# Patient Record
Sex: Female | Born: 1935 | Hispanic: No | Marital: Single | Smoking: Never smoker
Health system: Southern US, Community
[De-identification: ages and names within clinical notes are randomized; demographics above are authoritative.]

## PROBLEM LIST (undated history)

## (undated) DIAGNOSIS — I2510 Atherosclerotic heart disease of native coronary artery without angina pectoris: Secondary | ICD-10-CM

## (undated) DIAGNOSIS — N390 Urinary tract infection, site not specified: Secondary | ICD-10-CM

## (undated) DIAGNOSIS — I509 Heart failure, unspecified: Secondary | ICD-10-CM

## (undated) DIAGNOSIS — J45909 Unspecified asthma, uncomplicated: Secondary | ICD-10-CM

## (undated) DIAGNOSIS — E119 Type 2 diabetes mellitus without complications: Secondary | ICD-10-CM

## (undated) DIAGNOSIS — J449 Chronic obstructive pulmonary disease, unspecified: Secondary | ICD-10-CM

## (undated) DIAGNOSIS — K219 Gastro-esophageal reflux disease without esophagitis: Secondary | ICD-10-CM

## (undated) DIAGNOSIS — I10 Essential (primary) hypertension: Secondary | ICD-10-CM

## (undated) HISTORY — PX: CORONARY ARTERY BYPASS GRAFT: SHX141

## (undated) HISTORY — PX: ABDOMINAL HYSTERECTOMY: SHX81

## (undated) HISTORY — PX: APPENDECTOMY: SHX54

---

## 2015-03-17 ENCOUNTER — Emergency Department (HOSPITAL_COMMUNITY)
Admission: EM | Admit: 2015-03-17 | Discharge: 2015-03-17 | Disposition: A | Discharge status: Home / Self Care | Payer: Medicare Other | Attending: Emergency Medicine | Admitting: Emergency Medicine

## 2015-03-17 ENCOUNTER — Encounter (HOSPITAL_COMMUNITY): Payer: Self-pay | Admitting: Emergency Medicine

## 2015-03-17 ENCOUNTER — Other Ambulatory Visit (HOSPITAL_COMMUNITY): Payer: Medicare Other

## 2015-03-17 ENCOUNTER — Emergency Department (HOSPITAL_COMMUNITY): Payer: Medicare Other

## 2015-03-17 DIAGNOSIS — R1030 Lower abdominal pain, unspecified: Secondary | ICD-10-CM | POA: Diagnosis not present

## 2015-03-17 DIAGNOSIS — J449 Chronic obstructive pulmonary disease, unspecified: Secondary | ICD-10-CM | POA: Insufficient documentation

## 2015-03-17 DIAGNOSIS — I509 Heart failure, unspecified: Secondary | ICD-10-CM | POA: Insufficient documentation

## 2015-03-17 DIAGNOSIS — R2689 Other abnormalities of gait and mobility: Secondary | ICD-10-CM | POA: Insufficient documentation

## 2015-03-17 DIAGNOSIS — D649 Anemia, unspecified: Secondary | ICD-10-CM | POA: Insufficient documentation

## 2015-03-17 DIAGNOSIS — I10 Essential (primary) hypertension: Secondary | ICD-10-CM | POA: Insufficient documentation

## 2015-03-17 DIAGNOSIS — E119 Type 2 diabetes mellitus without complications: Secondary | ICD-10-CM | POA: Insufficient documentation

## 2015-03-17 DIAGNOSIS — Z8719 Personal history of other diseases of the digestive system: Secondary | ICD-10-CM | POA: Insufficient documentation

## 2015-03-17 DIAGNOSIS — R110 Nausea: Secondary | ICD-10-CM | POA: Diagnosis not present

## 2015-03-17 DIAGNOSIS — Z8742 Personal history of other diseases of the female genital tract: Secondary | ICD-10-CM | POA: Diagnosis not present

## 2015-03-17 DIAGNOSIS — Z9089 Acquired absence of other organs: Secondary | ICD-10-CM | POA: Diagnosis not present

## 2015-03-17 DIAGNOSIS — R269 Unspecified abnormalities of gait and mobility: Secondary | ICD-10-CM

## 2015-03-17 DIAGNOSIS — Z90710 Acquired absence of both cervix and uterus: Secondary | ICD-10-CM | POA: Diagnosis not present

## 2015-03-17 HISTORY — DX: Type 2 diabetes mellitus without complications: E11.9

## 2015-03-17 HISTORY — DX: Chronic obstructive pulmonary disease, unspecified: J44.9

## 2015-03-17 HISTORY — DX: Unspecified asthma, uncomplicated: J45.909

## 2015-03-17 HISTORY — DX: Gastro-esophageal reflux disease without esophagitis: K21.9

## 2015-03-17 HISTORY — DX: Essential (primary) hypertension: I10

## 2015-03-17 HISTORY — DX: Heart failure, unspecified: I50.9

## 2015-03-17 LAB — URINALYSIS, ROUTINE W REFLEX MICROSCOPIC
Bilirubin Urine: NEGATIVE
Glucose, UA: NEGATIVE mg/dL
Hgb urine dipstick: NEGATIVE
KETONES UR: NEGATIVE mg/dL
LEUKOCYTES UA: NEGATIVE
Nitrite: NEGATIVE
PH: 6 (ref 5.0–8.0)
Protein, ur: 30 mg/dL — AB
Specific Gravity, Urine: 1.01 (ref 1.005–1.030)
Urobilinogen, UA: 0.2 mg/dL (ref 0.0–1.0)

## 2015-03-17 LAB — CBC WITH DIFFERENTIAL/PLATELET
Basophils Absolute: 0 10*3/uL (ref 0.0–0.1)
Basophils Relative: 0 % (ref 0–1)
EOS ABS: 0.1 10*3/uL (ref 0.0–0.7)
Eosinophils Relative: 3 % (ref 0–5)
HCT: 27.1 % — ABNORMAL LOW (ref 36.0–46.0)
Hemoglobin: 8.6 g/dL — ABNORMAL LOW (ref 12.0–15.0)
LYMPHS ABS: 0.9 10*3/uL (ref 0.7–4.0)
Lymphocytes Relative: 17 % (ref 12–46)
MCH: 27.3 pg (ref 26.0–34.0)
MCHC: 31.7 g/dL (ref 30.0–36.0)
MCV: 86 fL (ref 78.0–100.0)
Monocytes Absolute: 0.3 10*3/uL (ref 0.1–1.0)
Monocytes Relative: 6 % (ref 3–12)
NEUTROS ABS: 3.9 10*3/uL (ref 1.7–7.7)
NEUTROS PCT: 74 % (ref 43–77)
Platelets: 256 10*3/uL (ref 150–400)
RBC: 3.15 MIL/uL — ABNORMAL LOW (ref 3.87–5.11)
RDW: 15 % (ref 11.5–15.5)
WBC: 5.3 10*3/uL (ref 4.0–10.5)

## 2015-03-17 LAB — BASIC METABOLIC PANEL
Anion gap: 10 (ref 5–15)
BUN: 56 mg/dL — ABNORMAL HIGH (ref 6–20)
CO2: 25 mmol/L (ref 22–32)
Calcium: 8.8 mg/dL — ABNORMAL LOW (ref 8.9–10.3)
Chloride: 105 mmol/L (ref 101–111)
Creatinine, Ser: 1.65 mg/dL — ABNORMAL HIGH (ref 0.44–1.00)
GFR calc non Af Amer: 27 mL/min — ABNORMAL LOW (ref >60)
GFR, EST AFRICAN AMERICAN: 32 mL/min — AB (ref >60)
Glucose, Bld: 117 mg/dL — ABNORMAL HIGH (ref 65–99)
Potassium: 3.6 mmol/L (ref 3.5–5.1)
SODIUM: 140 mmol/L (ref 135–145)

## 2015-03-17 LAB — URINE MICROSCOPIC-ADD ON

## 2015-03-17 MED ORDER — IOHEXOL 300 MG/ML  SOLN: 50.0000 mL | Freq: Once | INTRAMUSCULAR | Status: DC | PRN

## 2015-03-17 NOTE — ED Notes (Addendum)
Obtained urine via in and out cath specimen using sterile technique with assistance of Deandra, ED tech. Pt tolerated well. Pt noted incontinent of urine and bed wet. Family and pt refused to have linen changes. Teaching given on importance of staying dry.

## 2015-03-17 NOTE — Progress Notes (Addendum)
CSW received consult from RN CM regarding patient current living envirionment and patient current social needs. CSW and RN CM met with pt, and patient 2 daughters. Patient shares that she lives with one daughter, and they both moved to [place] from [facility] 2 weeks ago. Unfortunately at this time patient has been told she does not qualify for medicaid by $6 and was given a spend down plan in order to meet criteria. Patient received medicaid services in [facility] and was depending upon their care as patient is unable to ambulate independently and do care for self. Patient daughter lives in patient home with patient. Patient has no access into home due to steps. Patient family unable to afford ramp. Patient family states that the landlord stated he would make a ramp and then later states he is not able to and does not want anything permanent. CSW provided family with contact for [employer] [phone]  Regarding resources for a ramp. RN CM also arranging home health services, and those agencies have access to ramps for patients however it may be an out of pocket cost. At this time no further csw needs, however please consult csw if further needs arise.   Belia Heman, [place] Work  [employer] [phone]

## 2015-03-17 NOTE — ED Notes (Signed)
Case Management at bedside to speak with pt concerning living arrangements

## 2015-03-17 NOTE — ED Notes (Signed)
Pt and daughter request to leave bed wet with urine and to just place pads down. **do not wish to keep rolling pt**

## 2015-03-17 NOTE — Care Management Note (Addendum)
Case Management Note  Patient Details  Name: Leslie Parrish MRN: [id number] Date of Birth: [birth year]  Subjective/Objective:                  81 yr old medicare/previous [place] pt who [place] her daughters, lives with one daughter who is familiar with health care services in a rental home with 5 steps to front and back entries and pt states leaving the home with EMS was bumpy in a w/c because she does not walk.  The other daughter lives in an apartment with steps and a disabled child.  Both daughters are at bedside.  Pt was informed she did not qualify for [place] medicaid because of "six dollars over"  Family states they used all funds for trip to [facility] and the owner of their home is not assisting with a "temporary" ramp needed for pt.    Action/Plan:  ED CM and ED SW spoke with daughters and pt to assess needs.  CM reviewed in details medicare guidelines, home health [facility](HH) (length of stay in home, types of [facility] staff available, coverage, primary caregiver, up to 24 hrs before services may be started) and Private duty nursing (PDN-coverage, length of stay in the home types of staff available). SW reviewed availability of HH SW to assist pcp to get pt to snf (if desired disposition) from the community level. CM provided family with a list of [place] home health agencies and PDN. Cm spoke with Baxter HireKristen of [facility] about ramp to find cost of temporary ramp is > $100   Discussed ability of EDP to initiate home health orders but importance of a pcp for further services and needs after pt d/c from ED and EDP are not pcps.  1255 Cm provided pt with a list of [place] doctors, discuss need to find an accepting medicaid MD and have DSS enter MD name in data base prior to first appt once medicaid established in [place], Provided a list of [place] resources for assistance with bills, Provided 211 & senior resources of [place] information for  transportation plus other services until medicaid established, Provided 6 page lists of internal medicine, pulmonary and CV providers within 5-10 miles of zip [address], and cm business card. CM discussed and provided written information for self pay pcps vs EDPs, importance of pcp for f/u care, www.needymeds.org, www.goodrx.com, discounted pharmacies and other [place] resources such as [facility] , [facility], affordable care act,  financial assistance, self pay dental services, [place] med assist, DSS and  health department  Reviewed resources for [place] self pay pcps like [name], family medicine at [address], [facility], general medical clinics, [facility] urgent care plus others, medication resources, [facility] out patient pharmacies and housing Daughters voiced understanding and appreciation of resources provided. [facility] of home health agency is [facility]   1257 Cm spoke with Baxter HireKristen of [facility] to provide referral for home services, noting one of daughter to get pt into see Dr [name] Pt has all DME at home that is needed per daughters  Expected Discharge Date:   03/17/15               Expected Discharge Plan:   home with home health services   In-House Referral:   SW  Discharge planning Services   home health   Post Acute Care Choice:   home health  Choice offered to:   pt and daughters  DME Arranged:   na DME Agency:   na  HH Arranged:    HHRN, SW, PT, aide HH Agency:   [facility]  Status of Service:   completed  Additional Comments:  Ophelia Shoulder, RN 03/17/2015, 11:14 AM

## 2015-03-17 NOTE — ED Notes (Signed)
Bed: WA04 Expected date:  Expected time:  Means of arrival:  Comments: EMS 

## 2015-03-17 NOTE — ED Notes (Signed)
Pt arrived via EMS with c/o of lower abd pain, slight foul odor, denies urinary frequency/urgency and pressure/pain with voiding. Pt reported that she is incontinent and was recently treated for UTI and completed ABT x2 weeks. Abd soft/nondistended/nontender to palpate. Pt reported feeling of nausea without emesis. BS (+) and active x4 quadrants.

## 2015-03-17 NOTE — ED Notes (Signed)
Patient's family requesting to speak with CM/SW r/t establishing care and other medical issues with mother

## 2015-03-17 NOTE — Discharge Instructions (Signed)
If you were given medicines take as directed.  If you are on coumadin or contraceptives realize their levels and effectiveness is altered by many different medicines.  If you have any reaction (rash, tongues swelling, other) to the medicines stop taking and see a physician.    If your blood pressure was elevated in the ER make sure you follow up for management with a primary doctor or return for chest pain, shortness of breath or stroke symptoms.  Please follow up as directed and return to the ER or see a physician for new or worsening symptoms.  Thank you. Filed Vitals:   03/17/15 0947 03/17/15 0959  BP: 184/63   Pulse: 60   Temp: 97.8 F (36.6 C)   TempSrc: Oral   Resp: 20   SpO2: 99% 100%

## 2015-03-17 NOTE — ED Provider Notes (Signed)
CSN: [id number]     Arrival date & [id number] 03/17/15  [id number] History   First MD Initiated Contact with Patient 03/17/15 [phone]     Chief Complaint  Patient presents with  . Abdominal Pain     (Consider location/radiation/quality/duration/timing/severity/associated sxs/prior Treatment) HPI Comments: 81 year-old female with history of urine infection presents with suprapubic abdominal discomfort and urinary odor for the past 24 hours. Patient was seen for similar 2 weeks ago was given antibiotics and had improvement. Patient [place] and [place] in [place] way. Patient has mild nausea no vomiting or back pain. No other abdominal discomfort. Patient has had CT scans in the past for similar with no findings per patient. Patient is normally incontinent of urine. Patient currently rents a dwelling with stairs and she is unable to get up and down stairs that she is mostly wheelchair bound.  Patient is a 81 y.o. female presenting with abdominal pain. The history is provided by the patient.  Abdominal Pain Associated symptoms: nausea   Associated symptoms: no chest pain, no chills, no dysuria, no fever, no shortness of breath and no vomiting     Past Medical History  Diagnosis Date  . Hypertension   . CHF (congestive heart failure)   . GERD (gastroesophageal reflux disease)   . COPD (chronic obstructive pulmonary disease)   . Asthma   . Diabetes mellitus without complication    Past Surgical History  Procedure Laterality Date  . Abdominal hysterectomy    . Appendectomy     Family History  Problem Relation Age of Onset  . Family history unknown: Yes   History  Substance Use Topics  . Smoking status: Never Smoker   . Smokeless tobacco: Not on file  . Alcohol Use: No   OB History    Gravida Para Term Preterm AB TAB SAB Ectopic Multiple Living   6 6             Review of Systems  Constitutional: Negative for fever and chills.  HENT: Negative for congestion.    Eyes: Negative for visual disturbance.  Respiratory: Negative for shortness of breath.   Cardiovascular: Negative for chest pain.  Gastrointestinal: Positive for nausea and abdominal pain. Negative for vomiting.  Genitourinary: Positive for enuresis. Negative for dysuria and flank pain.  Musculoskeletal: Negative for back pain, neck pain and neck stiffness.  Skin: Negative for rash.  Neurological: Negative for light-headedness and headaches.      Allergies  Review of patient's allergies indicates not on file.  Home Medications   Prior to Admission medications   Not on File   BP 184/68 mmHg  Pulse 78  Temp(Src) 97.8 F (36.6 C) (Oral)  Resp 18  SpO2 100% Physical Exam  Constitutional: She is oriented to person, place, and time. She appears well-developed and well-nourished.  HENT:  Head: Normocephalic and atraumatic.  Eyes: Conjunctivae are normal. Right eye exhibits no discharge. Left eye exhibits no discharge.  Neck: Normal range of motion. Neck supple. No tracheal deviation present.  Cardiovascular: Normal rate and regular rhythm.   Pulmonary/Chest: Effort normal and breath sounds normal.  Abdominal: Soft. There is tenderness (obesemild suprapubic discomfort no other abdominal tenderness no peritonitis, obese). There is no guarding.  Genitourinary:  Patient has urine in her pelvis area, strong odor.  Musculoskeletal: She exhibits no edema.  Deconditioned, gen weakness  Neurological: She is alert and oriented to person, place, and time.  Skin: Skin is warm. No rash noted.  Psychiatric: She has a normal mood and affect.  Nursing note and vitals reviewed.   ED Course  Procedures (including critical care time) Labs Review Labs Reviewed  BASIC METABOLIC PANEL - Abnormal; Notable for the following:    Glucose, Bld 117 (*)    BUN 56 (*)    Creatinine, Ser 1.65 (*)    Calcium 8.8 (*)    GFR calc non Af Amer 27 (*)    GFR calc Af Amer 32 (*)    All other components  within normal limits  URINALYSIS, ROUTINE W REFLEX MICROSCOPIC - Abnormal; Notable for the following:    Protein, ur 30 (*)    All other components within normal limits  CBC WITH DIFFERENTIAL/PLATELET - Abnormal; Notable for the following:    RBC 3.15 (*)    Hemoglobin 8.6 (*)    HCT 27.1 (*)    All other components within normal limits  URINE CULTURE  URINE MICROSCOPIC-ADD ON  CBC WITH DIFFERENTIAL/PLATELET  POC OCCULT BLOOD, ED    Imaging Review No results found.   EKG Interpretation None      MDM   Final diagnoses:  Suprapubic abdominal pain, unspecified laterality  Essential hypertension  Lower abdominal pain  Anemia, unspecified anemia type  Gait difficulty   Patient presents with recurrent suprapubic pain urinary odor and changes concerning for urine infection. Abdominal exam otherwise benign. Plan for screening blood work, patient requested take her home high blood pressure medications as her blood pressures elevated today she is due to take. Discussed with case management/social worker for assistance with social needs at home. Patient recently moved here and will require primary doctor to follow-up with his well-appearing   On reassessment pt feels at baseline. Long discussion with family, patient new to the area, plan for local pcp and home health fup, pt saw case mgmt today.  Pt has no pain on recheck, CT canceled, her and family says pain similar to gas pain.  Pt normally anemia 8's and renal insuff.   Family comfortable with outpt fup, pt does not want to come in hospital or NH.  Results and differential diagnosis were discussed with the patient/parent/guardian. Close follow up outpatient was discussed, comfortable with the plan.   Medications  iohexol (OMNIPAQUE) 300 MG/ML solution 50 mL (not administered)    Filed Vitals:   03/17/15 0947 03/17/15 0959 03/17/15 1057  BP: 184/63  184/68  Pulse: 60  78  Temp: 97.8 F (36.6 C)    TempSrc: Oral    Resp: 20   18  SpO2: 99% 100% 100%    Final diagnoses:  Suprapubic abdominal pain, unspecified laterality  Essential hypertension  Lower abdominal pain  Anemia, unspecified anemia type  Gait difficulty       Blane Ohara, MD 03/17/15 1250

## 2015-03-18 LAB — URINE CULTURE
Colony Count: NO GROWTH
Culture: NO GROWTH

## 2015-03-20 ENCOUNTER — Telehealth (HOSPITAL_BASED_OUTPATIENT_CLINIC_OR_DEPARTMENT_OTHER): Payer: Self-pay | Admitting: Emergency Medicine

## 2015-03-28 ENCOUNTER — Encounter (HOSPITAL_COMMUNITY): Payer: Self-pay | Admitting: *Deleted

## 2015-03-28 ENCOUNTER — Emergency Department (HOSPITAL_COMMUNITY)
Admission: EM | Admit: 2015-03-28 | Discharge: 2015-03-28 | Disposition: A | Discharge status: Home / Self Care | Payer: Medicare Other | Attending: Emergency Medicine | Admitting: Emergency Medicine

## 2015-03-28 DIAGNOSIS — R110 Nausea: Secondary | ICD-10-CM | POA: Insufficient documentation

## 2015-03-28 DIAGNOSIS — I509 Heart failure, unspecified: Secondary | ICD-10-CM | POA: Diagnosis not present

## 2015-03-28 DIAGNOSIS — J449 Chronic obstructive pulmonary disease, unspecified: Secondary | ICD-10-CM | POA: Insufficient documentation

## 2015-03-28 DIAGNOSIS — Z7982 Long term (current) use of aspirin: Secondary | ICD-10-CM | POA: Insufficient documentation

## 2015-03-28 DIAGNOSIS — Z79899 Other long term (current) drug therapy: Secondary | ICD-10-CM | POA: Insufficient documentation

## 2015-03-28 DIAGNOSIS — N3090 Cystitis, unspecified without hematuria: Secondary | ICD-10-CM | POA: Insufficient documentation

## 2015-03-28 DIAGNOSIS — E119 Type 2 diabetes mellitus without complications: Secondary | ICD-10-CM | POA: Insufficient documentation

## 2015-03-28 DIAGNOSIS — Z880 Allergy status to penicillin: Secondary | ICD-10-CM | POA: Insufficient documentation

## 2015-03-28 DIAGNOSIS — K219 Gastro-esophageal reflux disease without esophagitis: Secondary | ICD-10-CM | POA: Diagnosis not present

## 2015-03-28 DIAGNOSIS — I10 Essential (primary) hypertension: Secondary | ICD-10-CM | POA: Diagnosis not present

## 2015-03-28 DIAGNOSIS — R300 Dysuria: Secondary | ICD-10-CM | POA: Diagnosis present

## 2015-03-28 LAB — CBC WITH DIFFERENTIAL/PLATELET
BASOS ABS: 0 10*3/uL (ref 0.0–0.1)
Basophils Relative: 0 % (ref 0–1)
EOS PCT: 4 % (ref 0–5)
Eosinophils Absolute: 0.2 10*3/uL (ref 0.0–0.7)
HCT: 28.9 % — ABNORMAL LOW (ref 36.0–46.0)
Hemoglobin: 8.9 g/dL — ABNORMAL LOW (ref 12.0–15.0)
LYMPHS PCT: 19 % (ref 12–46)
Lymphs Abs: 1.1 10*3/uL (ref 0.7–4.0)
MCH: 27.3 pg (ref 26.0–34.0)
MCHC: 30.8 g/dL (ref 30.0–36.0)
MCV: 88.7 fL (ref 78.0–100.0)
Monocytes Absolute: 0.5 10*3/uL (ref 0.1–1.0)
Monocytes Relative: 9 % (ref 3–12)
Neutro Abs: 3.8 10*3/uL (ref 1.7–7.7)
Neutrophils Relative %: 68 % (ref 43–77)
Platelets: 235 10*3/uL (ref 150–400)
RBC: 3.26 MIL/uL — ABNORMAL LOW (ref 3.87–5.11)
RDW: 15.4 % (ref 11.5–15.5)
WBC: 5.5 10*3/uL (ref 4.0–10.5)

## 2015-03-28 LAB — BASIC METABOLIC PANEL
ANION GAP: 9 (ref 5–15)
BUN: 53 mg/dL — AB (ref 6–20)
CO2: 27 mmol/L (ref 22–32)
Calcium: 8.8 mg/dL — ABNORMAL LOW (ref 8.9–10.3)
Chloride: 104 mmol/L (ref 101–111)
Creatinine, Ser: 2.01 mg/dL — ABNORMAL HIGH (ref 0.44–1.00)
GFR calc non Af Amer: 22 mL/min — ABNORMAL LOW (ref >60)
GFR, EST AFRICAN AMERICAN: 25 mL/min — AB (ref >60)
Glucose, Bld: 110 mg/dL — ABNORMAL HIGH (ref 65–99)
POTASSIUM: 4.8 mmol/L (ref 3.5–5.1)
Sodium: 140 mmol/L (ref 135–145)

## 2015-03-28 LAB — URINALYSIS, ROUTINE W REFLEX MICROSCOPIC
Bilirubin Urine: NEGATIVE
Glucose, UA: NEGATIVE mg/dL
Ketones, ur: NEGATIVE mg/dL
Nitrite: POSITIVE — AB
Protein, ur: NEGATIVE mg/dL
Specific Gravity, Urine: 1.008 (ref 1.005–1.030)
Urobilinogen, UA: 0.2 mg/dL (ref 0.0–1.0)
pH: 6 (ref 5.0–8.0)

## 2015-03-28 LAB — URINE MICROSCOPIC-ADD ON

## 2015-03-28 MED ORDER — CEFPODOXIME PROXETIL 200 MG PO TABS
200.0000 mg | ORAL_TABLET | Freq: Once | ORAL | Status: AC
  Administered 2015-03-28: 200 mg via ORAL
  Filled 2015-03-28: qty 1

## 2015-03-28 MED ORDER — OXYCODONE-ACETAMINOPHEN 5-325 MG PO TABS
1.0000 | ORAL_TABLET | Freq: Once | ORAL | Status: DC
  Filled 2015-03-28: qty 1

## 2015-03-28 MED ORDER — CEFPODOXIME PROXETIL 100 MG PO TABS
100.0000 mg | ORAL_TABLET | Freq: Two times a day (BID) | ORAL | Status: DC
Start: 2015-03-28 — End: 2015-04-30

## 2015-03-28 NOTE — Discharge Instructions (Signed)

## 2015-03-28 NOTE — ED Notes (Signed)
PTAR  At bedside. 

## 2015-03-28 NOTE — ED Provider Notes (Signed)
CSN: [id number]     Arrival date & time 03/28/15  1546 History   First MD Initiated Contact with Patient 03/28/15 1606     Chief Complaint  Patient presents with  . Dysuria     (Consider location/radiation/quality/duration/timing/severity/associated sxs/prior Treatment) Patient is a 81 y.o. female presenting with dysuria.  Dysuria Pain quality:  Burning Pain severity:  Severe Onset quality:  Gradual Duration:  2 days Timing:  Constant Progression:  Worsening Chronicity:  Recurrent Recent urinary tract infections: no   Relieved by:  Nothing Worsened by:  Nothing tried Ineffective treatments:  None tried Urinary symptoms: discolored urine and frequent urination   Associated symptoms: nausea   Associated symptoms: no abdominal pain and no vomiting     Past Medical History  Diagnosis Date  . Hypertension   . CHF (congestive heart failure)   . GERD (gastroesophageal reflux disease)   . COPD (chronic obstructive pulmonary disease)   . Asthma   . Diabetes mellitus without complication    Past Surgical History  Procedure Laterality Date  . Abdominal hysterectomy    . Appendectomy     Family History  Problem Relation Age of Onset  . Family history unknown: Yes   History  Substance Use Topics  . Smoking status: Never Smoker   . Smokeless tobacco: Not on file  . Alcohol Use: No   OB History    Gravida Para Term Preterm AB TAB SAB Ectopic Multiple Living   6 6             Review of Systems  Gastrointestinal: Positive for nausea. Negative for vomiting and abdominal pain.  Genitourinary: Positive for dysuria.  All other systems reviewed and are negative.     Allergies  Albuterol sulfate; Penicillins; Sulfa antibiotics; and Compazine  Home Medications   Prior to Admission medications   Medication Sig Start Date End Date Taking? Authorizing Provider  allopurinol (ZYLOPRIM) 100 MG tablet Take 200 mg by mouth daily.   Yes Historical Provider, MD  amLODipine  (NORVASC) 10 MG tablet Take 10 mg by mouth daily.   Yes Historical Provider, MD  anastrozole (ARIMIDEX) 1 MG tablet Take 1 mg by mouth daily.   Yes Historical Provider, MD  aspirin EC 81 MG tablet Take 81 mg by mouth daily.   Yes Historical Provider, MD  candesartan (ATACAND) 32 MG tablet Take 32 mg by mouth daily.   Yes Historical Provider, MD  Cholecalciferol (VITAMIN D-3 PO) Take 1 tablet by mouth daily.   Yes Historical Provider, MD  cloNIDine (CATAPRES) 0.2 MG tablet Take 0.2 mg by mouth 2 (two) times daily.   Yes Historical Provider, MD  dicyclomine (BENTYL) 20 MG tablet Take 20 mg by mouth daily as needed for spasms.   Yes Historical Provider, MD  hydrALAZINE (APRESOLINE) 100 MG tablet Take 100 mg by mouth 3 (three) times daily.   Yes Historical Provider, MD  ketoconazole (NIZORAL) 2 % cream Apply 1 application topically at bedtime. 12/21/14  Yes Historical Provider, MD  levalbuterol Pauline Aus) 1.25 MG/3ML nebulizer solution Inhale 1 ampule into the lungs 4 (four) times daily. 12/23/14  Yes Historical Provider, MD  ondansetron (ZOFRAN-ODT) 4 MG disintegrating tablet Take 4 mg by mouth every 8 (eight) hours as needed for nausea or vomiting.   Yes Historical Provider, MD  pantoprazole (PROTONIX) 40 MG tablet Take 40 mg by mouth daily.   Yes Historical Provider, MD  potassium chloride SA (K-DUR,KLOR-CON) 20 MEQ tablet Take 20 mEq by mouth daily.  Yes Historical Provider, MD  torsemide (DEMADEX) 20 MG tablet Take 20 mg by mouth daily.   Yes Historical Provider, MD  cefpodoxime (VANTIN) 100 MG tablet Take 1 tablet (100 mg total) by mouth 2 (two) times daily. 03/28/15   Mirian MoMatthew Philip Kotlyar, MD   BP 172/65 mmHg  Pulse 63  Temp(Src) 98.5 F (36.9 C) (Oral)  Resp 17  SpO2 95% Physical Exam  Constitutional: She is oriented to person, place, and time. She appears well-developed and well-nourished.  HENT:  Head: Normocephalic and atraumatic.  Right Ear: External ear normal.  Left Ear: External ear  normal.  Eyes: Conjunctivae and EOM are normal. Pupils are equal, round, and reactive to light.  Neck: Normal range of motion. Neck supple.  Cardiovascular: Normal rate, regular rhythm, normal heart sounds and intact distal pulses.   Pulmonary/Chest: Effort normal and breath sounds normal.  Abdominal: Soft. Bowel sounds are normal. There is no tenderness.  Musculoskeletal: Normal range of motion.  Neurological: She is alert and oriented to person, place, and time.  Skin: Skin is warm and dry.  Vitals reviewed.   ED Course  EAR CERUMEN REMOVAL Date/Time: 03/28/2015 7:25 PM Performed by: Mirian MoGENTRY, Davieon Stockham Authorized by: Mirian MoGENTRY, Shallyn Constancio Consent: Verbal consent obtained. Risks and benefits: risks, benefits and alternatives were discussed Local anesthetic: none Location details: right ear Procedure type: curette Patient sedated: no Patient tolerance: Patient tolerated the procedure well with no immediate complications  EAR CERUMEN REMOVAL Date/Time: 03/28/2015 7:25 PM Performed by: Mirian MoGENTRY, Ephriam Turman Authorized by: Mirian MoGENTRY, Camri Molloy Consent: Verbal consent obtained. Risks and benefits: risks, benefits and alternatives were discussed Local anesthetic: none Location details: left ear Procedure type: curette Patient sedated: no Patient tolerance: Patient tolerated the procedure well with no immediate complications   (including critical care time) Labs Review Labs Reviewed  URINALYSIS, ROUTINE W REFLEX MICROSCOPIC (NOT AT [facility]) - Abnormal; Notable for the following:    Color, Urine ORANGE (*)    APPearance CLOUDY (*)    Hgb urine dipstick TRACE (*)    Nitrite POSITIVE (*)    Leukocytes, UA LARGE (*)    All other components within normal limits  CBC WITH DIFFERENTIAL/PLATELET - Abnormal; Notable for the following:    RBC 3.26 (*)    Hemoglobin 8.9 (*)    HCT 28.9 (*)    All other components within normal limits  BASIC METABOLIC PANEL - Abnormal; Notable for the following:    Glucose,  Bld 110 (*)    BUN 53 (*)    Creatinine, Ser 2.01 (*)    Calcium 8.8 (*)    GFR calc non Af Amer 22 (*)    GFR calc Af Amer 25 (*)    All other components within normal limits  URINE MICROSCOPIC-ADD ON - Abnormal; Notable for the following:    Squamous Epithelial / LPF FEW (*)    Bacteria, UA MANY (*)    All other components within normal limits  URINE CULTURE    Imaging Review No results found.   EKG Interpretation None      MDM   Final diagnoses:  Cystitis    81 y.o. female with pertinent PMH of CHF, COPD (home O2 2L), DM presents with recurrent dysuria.   Physical exam benign, cleared ears for cerumen impaction.  UA with gross infection.  No signs of pyelo on exam or history.  DC home to fu with PCP.   I have reviewed all laboratory and imaging studies if ordered as above  1. Cystitis  Mirian Mo, MD 03/28/15 [phone]

## 2015-03-28 NOTE — ED Notes (Signed)
Per EMS, pt from home, reports severe dysuria x 2 days.  Pt was also having audible wheezing upon arrival on scene.  EMS reports pt is allergic to albuterol.  Hx of COPD.  Wears home O2 2.5L [place]

## 2015-03-28 NOTE — Progress Notes (Signed)
EDCM spoke to patient and family member at bedside.  Patient lives at home.  Patient wears 2.5 liters of oxygen at home.  Patient's daughter stated, "We remember you.  The home health services that were arranged were not helpful at all.  She's not Medicaid eligible."  Patient's daughter reports patient does not need home health services.  "She needs home health aides."  [facility] nursing agencies however, patient and patient's daughter refused.  Patient stated, "We don't have that kind of money."  Patient then requesting records from her ED visit today.  [facility] that she would have to fill out a form and mail back to medical records.  EDRN at bedside instructed patient and family regarding MyChart.  Patient without home health needs at this time.

## 2015-03-31 LAB — URINE CULTURE
Colony Count: >=100000
SPECIAL REQUESTS: NORMAL

## 2015-04-01 ENCOUNTER — Telehealth (HOSPITAL_BASED_OUTPATIENT_CLINIC_OR_DEPARTMENT_OTHER): Payer: Self-pay | Admitting: Emergency Medicine

## 2015-04-01 NOTE — Telephone Encounter (Signed)
Post ED Visit - Positive Culture Follow-up: Successful Patient Follow-Up  Culture assessed and recommendations reviewed by: [x]  Celedonio MiyamotoJeremy Frens, Pharm.D., BCPS-AQ ID []  Georgina PillionElizabeth Martin, Pharm.D., BCPS []  [employer] Ground[employer]h Pham, [address] BoulevardPharm.D., BCPS, AAHIVP []  Estella HuskMichelle Turner, Pharm.D., BCPS, AAHIVP []  Tegan Magsam, Pharm.D. []  Tennis Mustassie Stewart, Pharm.D.  Positive urine culture: E. Coli ESBL  []  Patient discharged without antimicrobial prescription and treatment is now indicated [x]  Organism is resistant to prescribed ED discharge antimicrobial []  Patient with positive blood cultures  Changes discussed with ED provider: Francee PiccoloJennifer Piepenbrink PA New antibiotic prescription: stop cefpodoxime and start fosfomycin 3gram po x 1 dose [facility] [address]  Contacted patient, 04/01/15 1111   Berle MullMiller, Kandy Towery 04/01/2015, 11:09 AM

## 2015-04-01 NOTE — Progress Notes (Signed)
ED Antimicrobial Stewardship Positive Culture Follow Up   Leslie Parrish is an 81 y.o. female who presented to [facility] on [facility] with a chief complaint of  Chief Complaint  Patient presents with  . Dysuria    Recent Results (from the past 720 hour(s))  Urine culture     Status: None   Collection Time: 03/17/15 10:06 AM  Result Value Ref Range Status   Specimen Description URINE, CATHETERIZED  Final   Special Requests NONE  Final   Colony Count NO GROWTH Performed at [facility]   Final   Culture NO GROWTH Performed at [facility]   Final   Report Status 03/18/2015 FINAL  Final  Urine C&S (if patient has fever above 100.56F)     Status: None   Collection Time: 03/28/15  3:51 PM  Result Value Ref Range Status   Specimen Description URINE, CATHETERIZED  Final   Special Requests Normal  Final   Colony Count   Final    >=100,000 COLONIES/ML Performed at [facility]    Culture   Final    ESCHERICHIA COLI Note: Confirmed Extended Spectrum Beta-Lactamase Producer (ESBL) Performed at [facility]    Report Status 03/31/2015 FINAL  Final   Organism ID, Bacteria ESCHERICHIA COLI  Final      Susceptibility   Escherichia coli - MIC*    AMPICILLIN >=32 RESISTANT Resistant     CEFAZOLIN >=64 RESISTANT Resistant     CEFTRIAXONE >=64 RESISTANT Resistant     CIPROFLOXACIN >=4 RESISTANT Resistant     GENTAMICIN >=16 RESISTANT Resistant     LEVOFLOXACIN >=8 RESISTANT Resistant     NITROFURANTOIN 32 SENSITIVE Sensitive     TOBRAMYCIN >=16 RESISTANT Resistant     TRIMETH/SULFA >=320 RESISTANT Resistant     IMIPENEM <=0.25 SENSITIVE Sensitive     PIP/TAZO 8 SENSITIVE Sensitive     * ESCHERICHIA COLI    [x]  Treated with cefpodoxime, organism resistant to prescribed antimicrobial []  Patient discharged originally without antimicrobial agent and treatment is now indicated  New antibiotic prescription: Fosfomycin 3g po x 1 dose   ED Provider:  Francee PiccoloJennifer Piepenbrink, PA-C   Mickeal SkinnerFrens, Leslie Parrish 04/01/2015, 10:40 AM Infectious Diseases Pharmacist Phone# [phone]

## 2015-04-21 ENCOUNTER — Encounter (HOSPITAL_COMMUNITY): Payer: Self-pay

## 2015-04-21 ENCOUNTER — Emergency Department (HOSPITAL_COMMUNITY)
Admission: EM | Admit: 2015-04-21 | Discharge: 2015-04-21 | Disposition: A | Discharge status: Home / Self Care | Payer: Medicare Other | Attending: Emergency Medicine | Admitting: Emergency Medicine

## 2015-04-21 DIAGNOSIS — I509 Heart failure, unspecified: Secondary | ICD-10-CM | POA: Diagnosis not present

## 2015-04-21 DIAGNOSIS — R300 Dysuria: Secondary | ICD-10-CM | POA: Diagnosis present

## 2015-04-21 DIAGNOSIS — Z79899 Other long term (current) drug therapy: Secondary | ICD-10-CM | POA: Insufficient documentation

## 2015-04-21 DIAGNOSIS — I10 Essential (primary) hypertension: Secondary | ICD-10-CM | POA: Diagnosis not present

## 2015-04-21 DIAGNOSIS — E119 Type 2 diabetes mellitus without complications: Secondary | ICD-10-CM | POA: Insufficient documentation

## 2015-04-21 DIAGNOSIS — J449 Chronic obstructive pulmonary disease, unspecified: Secondary | ICD-10-CM | POA: Diagnosis not present

## 2015-04-21 DIAGNOSIS — K219 Gastro-esophageal reflux disease without esophagitis: Secondary | ICD-10-CM | POA: Diagnosis not present

## 2015-04-21 DIAGNOSIS — Z7982 Long term (current) use of aspirin: Secondary | ICD-10-CM | POA: Diagnosis not present

## 2015-04-21 DIAGNOSIS — Z880 Allergy status to penicillin: Secondary | ICD-10-CM | POA: Insufficient documentation

## 2015-04-21 DIAGNOSIS — Z87440 Personal history of urinary (tract) infections: Secondary | ICD-10-CM | POA: Insufficient documentation

## 2015-04-21 HISTORY — DX: Urinary tract infection, site not specified: N39.0

## 2015-04-21 LAB — URINALYSIS, ROUTINE W REFLEX MICROSCOPIC
Bilirubin Urine: NEGATIVE
Glucose, UA: NEGATIVE mg/dL
Hgb urine dipstick: NEGATIVE
Ketones, ur: NEGATIVE mg/dL
Nitrite: POSITIVE — AB
Protein, ur: 30 mg/dL — AB
Specific Gravity, Urine: 1.011 (ref 1.005–1.030)
Urobilinogen, UA: 0.2 mg/dL (ref 0.0–1.0)
pH: 6 (ref 5.0–8.0)

## 2015-04-21 LAB — CBC WITH DIFFERENTIAL/PLATELET
BASOS ABS: 0 10*3/uL (ref 0.0–0.1)
Basophils Relative: 0 % (ref 0–1)
Eosinophils Absolute: 0.1 10*3/uL (ref 0.0–0.7)
Eosinophils Relative: 2 % (ref 0–5)
HCT: 29.2 % — ABNORMAL LOW (ref 36.0–46.0)
HEMOGLOBIN: 8.9 g/dL — AB (ref 12.0–15.0)
LYMPHS ABS: 0.8 10*3/uL (ref 0.7–4.0)
Lymphocytes Relative: 13 % (ref 12–46)
MCH: 26.1 pg (ref 26.0–34.0)
MCHC: 30.5 g/dL (ref 30.0–36.0)
MCV: 85.6 fL (ref 78.0–100.0)
MONOS PCT: 7 % (ref 3–12)
Monocytes Absolute: 0.4 10*3/uL (ref 0.1–1.0)
NEUTROS ABS: 4.8 10*3/uL (ref 1.7–7.7)
NEUTROS PCT: 78 % — AB (ref 43–77)
Platelets: 250 10*3/uL (ref 150–400)
RBC: 3.41 MIL/uL — ABNORMAL LOW (ref 3.87–5.11)
RDW: 15.1 % (ref 11.5–15.5)
WBC: 6.2 10*3/uL (ref 4.0–10.5)

## 2015-04-21 LAB — COMPREHENSIVE METABOLIC PANEL
ALT: 8 U/L — AB (ref 14–54)
AST: 16 U/L (ref 15–41)
Albumin: 3.5 g/dL (ref 3.5–5.0)
Alkaline Phosphatase: 48 U/L (ref 38–126)
Anion gap: 11 (ref 5–15)
BUN: 53 mg/dL — ABNORMAL HIGH (ref 6–20)
CALCIUM: 8.9 mg/dL (ref 8.9–10.3)
CO2: 26 mmol/L (ref 22–32)
CREATININE: 1.65 mg/dL — AB (ref 0.44–1.00)
Chloride: 103 mmol/L (ref 101–111)
GFR calc Af Amer: 32 mL/min — ABNORMAL LOW (ref >60)
GFR, EST NON AFRICAN AMERICAN: 27 mL/min — AB (ref >60)
Glucose, Bld: 201 mg/dL — ABNORMAL HIGH (ref 65–99)
POTASSIUM: 3.8 mmol/L (ref 3.5–5.1)
Sodium: 140 mmol/L (ref 135–145)
TOTAL PROTEIN: 6.8 g/dL (ref 6.5–8.1)
Total Bilirubin: 0.3 mg/dL (ref 0.3–1.2)

## 2015-04-21 LAB — URINE MICROSCOPIC-ADD ON

## 2015-04-21 MED ORDER — URIBEL 118 MG PO CAPS: ORAL_CAPSULE | ORAL | Status: DC

## 2015-04-21 NOTE — ED Notes (Signed)
Pt taken to the restroom in attempt to collect urine sample. Pt very aggravated and refuses to let staff assist her. Requests only daughter help her as "she is the only one that knows how to do it." Hat placed in toilet, daughter in restroom.

## 2015-04-21 NOTE — ED Notes (Signed)
Pt presents NAD. UTI x 10 day. Seen by Isabel CapriceGrapey on Friday.  Did not give antibiotic. Continues with symptoms. AZO helps however cannot continue. Here 03/28/15 for UTI. Denies N/V/D and fever

## 2015-04-21 NOTE — ED Provider Notes (Signed)
CSN: [id number]     Arrival date & time 04/21/15  1242 History   First MD Initiated Contact with Patient 04/21/15 1516     Chief Complaint  Patient presents with  . Dysuria     (Consider location/radiation/quality/duration/timing/severity/associated sxs/prior Treatment) HPI   Leslie Parrish is a 81 y.o. female who presents for evaluation of ongoing dysuria, frequency and urge incontinence. She denies fever, chills, nausea, vomiting, weakness or dizziness. She is using, AZO, without relief. She saw her urologist last week and was told today that her urine culture returned showing Escherichia coli. Her urologist, Dr. Isabel Caprice, does not want to prescribe an antibody This time because she does not have systemic symptoms. The patient has a complex history for urinary tract infections, including an episode of urosepsis, remotely. She is new to [place], and does not have a PCP yet. She is taking her usual medications. She has chronic ongoing lower back pain. She requests a stronger pain pill for it. There are no other known modifying factors.  Past Medical History  Diagnosis Date  . Hypertension   . CHF (congestive heart failure)   . GERD (gastroesophageal reflux disease)   . COPD (chronic obstructive pulmonary disease)   . Asthma   . Diabetes mellitus without complication   . UTI (lower urinary tract infection)    Past Surgical History  Procedure Laterality Date  . Abdominal hysterectomy    . Appendectomy     Family History  Problem Relation Age of Onset  . Family history unknown: Yes   History  Substance Use Topics  . Smoking status: Never Smoker   . Smokeless tobacco: Not on file  . Alcohol Use: No   OB History    Gravida Para Term Preterm AB TAB SAB Ectopic Multiple Living   6 6             Review of Systems  All other systems reviewed and are negative.     Allergies  Albuterol sulfate; Penicillins; Sulfa antibiotics; and Compazine  Home Medications   Prior to  Admission medications   Medication Sig Start Date End Date Taking? Authorizing Provider  allopurinol (ZYLOPRIM) 100 MG tablet Take 200 mg by mouth daily.    Historical Provider, MD  amLODipine (NORVASC) 10 MG tablet Take 10 mg by mouth daily.    Historical Provider, MD  anastrozole (ARIMIDEX) 1 MG tablet Take 1 mg by mouth daily.    Historical Provider, MD  aspirin EC 81 MG tablet Take 81 mg by mouth daily.    Historical Provider, MD  candesartan (ATACAND) 32 MG tablet Take 32 mg by mouth daily.    Historical Provider, MD  cefpodoxime (VANTIN) 100 MG tablet Take 1 tablet (100 mg total) by mouth 2 (two) times daily. 03/28/15   Mirian Mo, MD  Cholecalciferol (VITAMIN D-3 PO) Take 1 tablet by mouth daily.    Historical Provider, MD  cloNIDine (CATAPRES) 0.2 MG tablet Take 0.2 mg by mouth 2 (two) times daily.    Historical Provider, MD  dicyclomine (BENTYL) 20 MG tablet Take 20 mg by mouth daily as needed for spasms.    Historical Provider, MD  hydrALAZINE (APRESOLINE) 100 MG tablet Take 100 mg by mouth 3 (three) times daily.    Historical Provider, MD  ketoconazole (NIZORAL) 2 % cream Apply 1 application topically at bedtime. 12/21/14   Historical Provider, MD  levalbuterol Pauline Aus) 1.25 MG/3ML nebulizer solution Inhale 1 ampule into the lungs 4 (four) times daily. 12/23/14  Historical Provider, MD  Meth-Hyo-M Bl-Na Phos-Ph Sal (URIBEL) 118 MG CAPS 1 QID prn dysuria 04/21/15   Mancel Bale, MD  ondansetron (ZOFRAN-ODT) 4 MG disintegrating tablet Take 4 mg by mouth every 8 (eight) hours as needed for nausea or vomiting.    Historical Provider, MD  pantoprazole (PROTONIX) 40 MG tablet Take 40 mg by mouth daily.    Historical Provider, MD  potassium chloride SA (K-DUR,KLOR-CON) 20 MEQ tablet Take 20 mEq by mouth daily.    Historical Provider, MD  torsemide (DEMADEX) 20 MG tablet Take 20 mg by mouth daily.    Historical Provider, MD   BP 177/66 mmHg  Pulse 82  Temp(Src) 97.8 F (36.6 C) (Oral)   Resp 18  SpO2 99% Physical Exam  Constitutional: She is oriented to person, place, and time. She appears well-developed.  Elderly, frail  HENT:  Head: Normocephalic and atraumatic.  Right Ear: External ear normal.  Left Ear: External ear normal.  Eyes: Conjunctivae and EOM are normal. Pupils are equal, round, and reactive to light.  Neck: Normal range of motion and phonation normal. Neck supple.  Cardiovascular: Normal rate, regular rhythm and normal heart sounds.   Pulmonary/Chest: Effort normal and breath sounds normal. She exhibits no bony tenderness.  Abdominal: Soft. She exhibits no distension. There is no tenderness.  Nontender midline hernia.  Genitourinary:  No costovertebral angle tenderness with percussion.  Musculoskeletal: Normal range of motion.  Mild lower lumbar tenderness to palpation.  Neurological: She is alert and oriented to person, place, and time. No cranial nerve deficit or sensory deficit. She exhibits normal muscle tone. Coordination normal.  Skin: Skin is warm, dry and intact.  Psychiatric: She has a normal mood and affect. Her behavior is normal. Judgment and thought content normal.  Nursing note and vitals reviewed.   ED Course  Procedures (including critical care time)  Medications - No data to display  Patient Vitals for the past 24 hrs:  BP Temp Temp src Pulse Resp SpO2  04/21/15 1250 177/66 mmHg 97.8 F (36.6 C) Oral 82 18 99 %    16:00- case discussed with her urologist. He is choosing to monitor her, for symptoms of worsening urinary tract infection. This is because she has a ESBL Escherichia coli. It would be in her best interest, to avoid using antibiotics unless she is systemically ill.  4:50 PM Reevaluation with update and discussion. After initial assessment and treatment, an updated evaluation reveals patient does not have any additional complaints. I'm extensive discussion with the patient and her daughter, who was with her. They  understand the recommended treatments. Leslie Parrish    Labs Review Labs Reviewed  COMPREHENSIVE METABOLIC PANEL - Abnormal; Notable for the following:    Glucose, Bld 201 (*)    BUN 53 (*)    Creatinine, Ser 1.65 (*)    ALT 8 (*)    GFR calc non Af Amer 27 (*)    GFR calc Af Amer 32 (*)    All other components within normal limits  CBC WITH DIFFERENTIAL/PLATELET - Abnormal; Notable for the following:    RBC 3.41 (*)    Hemoglobin 8.9 (*)    HCT 29.2 (*)    Neutrophils Relative % 78 (*)    All other components within normal limits  URINALYSIS, ROUTINE W REFLEX MICROSCOPIC (NOT AT [facility]) - Abnormal; Notable for the following:    Color, Urine ORANGE (*)    APPearance TURBID (*)    Protein, ur 30 (*)  Nitrite POSITIVE (*)    Leukocytes, UA LARGE (*)    All other components within normal limits  URINE MICROSCOPIC-ADD ON - Abnormal; Notable for the following:    Bacteria, UA MANY (*)    All other components within normal limits    Imaging Review No results found.   EKG Interpretation None      MDM   Final diagnoses:  Dysuria    Dysuria related to colonization with Escherichia coli. Patient does not have indicators for acute infection at this time. She understands that further antibody treatments now, would not be in her best interest.  Nursing Notes Reviewed/ Care Coordinated Applicable Imaging Reviewed Interpretation of Laboratory Data incorporated into ED treatment  The patient appears reasonably screened and/or stabilized for discharge and I doubt any other medical condition or other [facility] requiring further screening, evaluation, or treatment in the ED at this time prior to discharge.  Plan: Home Medications- Uribel; Home Treatments- rest, fluids; return here if the recommended treatment, does not improve the symptoms; Recommended follow up- PCP asap. Urology as scheduled.     Mancel Bale, MD 04/21/15 1655

## 2015-04-21 NOTE — Progress Notes (Signed)
This CM spoke with pt and her 2 daughters in detail on 03/17/15 Cm provided all resources available to pt and family This [facility] CM on 03/[facility]/15 and informed home health services not needed [facility] ED PM CM provided another list of private duty nursing resources  presents NAD. UTI x 10 day. Seen by Isabel CapriceGrapey on Friday. Did not give antibiotic. Continues with symptoms. AZO helps however cannot continue Pt and family offered a list of medicare providers within zip code [phone] and a list of uninsured resources  CM discussed and provided written information for self pay pcps, discussed the importance of pcp vs EDP services for f/u care, www.needymeds.org, www.goodrx.com, discounted pharmacies and other [place] resources such as [facility] , [facility], affordable care act,  [place] med assist, financial assistance, self pay dental services, [place] med assist, DSS and  health department  Reviewed resources for [place] self pay pcps like [name], family medicine at [address], [facility], [facility], local urgent care centers, [facility], [facility], general medical clinics, family services of the [facility], [facility] plus others, medication resources, [facility] out patient pharmacies and housing Pt voiced understanding and appreciation of resources provided

## 2015-04-21 NOTE — Discharge Instructions (Signed)
Your urine culture showed a complex antibody called ESBL E. Coli. It is best to not treat this, as an infection, with antibiotics at this time. Things that you can do to improve your symptoms include drinking 3-4 glasses of water each day. Also use the prescribed medication, Uribel, to help with your discomfort. Watch for fever, chills, nausea or vomiting. Return here, if needed, for problems.    Dysuria Dysuria is the medical term for pain with urination. There are many causes for dysuria, but urinary tract infection is the most common. If a urinalysis was performed it can show that there is a urinary tract infection. A urine culture confirms that you or your child is sick. You will need to follow up with a healthcare provider because:  If a urine culture was done you will need to know the culture results and treatment recommendations.  If the urine culture was positive, you or your child will need to be put on antibiotics or know if the antibiotics prescribed are the right antibiotics for your urinary tract infection.  If the urine culture is negative (no urinary tract infection), then other causes may need to be explored or antibiotics need to be stopped. Today laboratory work may have been done and there does not seem to be an infection. If cultures were done they will take at least 24 to 48 hours to be completed. Today x-rays may have been taken and they read as normal. No cause can be found for the problems. The x-rays may be re-read by a radiologist and you will be contacted if additional findings are made. You or your child may have been put on medications to help with this problem until you can see your primary caregiver. If the problems get better, see your primary caregiver if the problems return. If you were given antibiotics (medications which kill germs), take all of the mediations as directed for the full course of treatment.  If laboratory work was done, you need to find the  results. Leave a telephone number where you can be reached. If this is not possible, make sure you find out how you are to get test results. HOME CARE INSTRUCTIONS   Drink lots of fluids. For adults, drink eight, 8 ounce glasses of clear juice or water a day. For children, replace fluids as suggested by your caregiver.  Empty the bladder often. Avoid holding urine for long periods of time.  After a bowel movement, women should cleanse front to back, using each tissue only once.  Empty your bladder before and after sexual intercourse.  Take all the medicine given to you until it is gone. You may feel better in a few days, but TAKE ALL MEDICINE.  Avoid caffeine, tea, alcohol and carbonated beverages, because they tend to irritate the bladder.  In men, alcohol may irritate the prostate.  Only take over-the-counter or prescription medicines for pain, discomfort, or fever as directed by your caregiver.  If your caregiver has given you a follow-up appointment, it is very important to keep that appointment. Not keeping the appointment could result in a chronic or permanent injury, pain, and disability. If there is any problem keeping the appointment, you must call back to this facility for assistance. SEEK IMMEDIATE MEDICAL CARE IF:   Back pain develops.  A fever develops.  There is nausea (feeling sick to your stomach) or vomiting (throwing up).  Problems are no better with medications or are getting worse. MAKE SURE YOU:   Understand  these instructions.  Will watch your condition.  Will get help right away if you are not doing well or get worse. Document Released: 07/09/2004 Document Revised: 01/03/2012 Document Reviewed: 05/16/2008 [facility] Patient Information 2015 [place], [place]. This information is not intended to replace advice given to you by your health care provider. Make sure you discuss any questions you have with your health care provider.

## 2015-04-22 ENCOUNTER — Encounter (HOSPITAL_COMMUNITY): Payer: Self-pay | Admitting: *Deleted

## 2015-04-22 ENCOUNTER — Emergency Department (HOSPITAL_COMMUNITY)
Admission: EM | Admit: 2015-04-22 | Discharge: 2015-04-22 | Disposition: A | Discharge status: Home / Self Care | Payer: Medicare Other | Attending: Emergency Medicine | Admitting: Emergency Medicine

## 2015-04-22 ENCOUNTER — Emergency Department (HOSPITAL_COMMUNITY): Payer: Medicare Other

## 2015-04-22 DIAGNOSIS — Z79899 Other long term (current) drug therapy: Secondary | ICD-10-CM | POA: Insufficient documentation

## 2015-04-22 DIAGNOSIS — Z880 Allergy status to penicillin: Secondary | ICD-10-CM | POA: Insufficient documentation

## 2015-04-22 DIAGNOSIS — Z7982 Long term (current) use of aspirin: Secondary | ICD-10-CM | POA: Insufficient documentation

## 2015-04-22 DIAGNOSIS — K219 Gastro-esophageal reflux disease without esophagitis: Secondary | ICD-10-CM | POA: Diagnosis not present

## 2015-04-22 DIAGNOSIS — I10 Essential (primary) hypertension: Secondary | ICD-10-CM | POA: Insufficient documentation

## 2015-04-22 DIAGNOSIS — R109 Unspecified abdominal pain: Secondary | ICD-10-CM

## 2015-04-22 DIAGNOSIS — K439 Ventral hernia without obstruction or gangrene: Secondary | ICD-10-CM | POA: Insufficient documentation

## 2015-04-22 DIAGNOSIS — Z87440 Personal history of urinary (tract) infections: Secondary | ICD-10-CM | POA: Insufficient documentation

## 2015-04-22 DIAGNOSIS — I509 Heart failure, unspecified: Secondary | ICD-10-CM | POA: Diagnosis not present

## 2015-04-22 DIAGNOSIS — E119 Type 2 diabetes mellitus without complications: Secondary | ICD-10-CM | POA: Diagnosis not present

## 2015-04-22 DIAGNOSIS — R300 Dysuria: Secondary | ICD-10-CM

## 2015-04-22 DIAGNOSIS — J449 Chronic obstructive pulmonary disease, unspecified: Secondary | ICD-10-CM | POA: Insufficient documentation

## 2015-04-22 LAB — COMPREHENSIVE METABOLIC PANEL
ALT: 7 U/L — AB (ref 14–54)
ANION GAP: 6 (ref 5–15)
AST: 13 U/L — AB (ref 15–41)
Albumin: 3.1 g/dL — ABNORMAL LOW (ref 3.5–5.0)
Alkaline Phosphatase: 51 U/L (ref 38–126)
BUN: 50 mg/dL — ABNORMAL HIGH (ref 6–20)
CALCIUM: 8.6 mg/dL — AB (ref 8.9–10.3)
CO2: 27 mmol/L (ref 22–32)
CREATININE: 1.71 mg/dL — AB (ref 0.44–1.00)
Chloride: 107 mmol/L (ref 101–111)
GFR calc Af Amer: 30 mL/min — ABNORMAL LOW (ref >60)
GFR, EST NON AFRICAN AMERICAN: 26 mL/min — AB (ref >60)
Glucose, Bld: 220 mg/dL — ABNORMAL HIGH (ref 65–99)
Potassium: 4 mmol/L (ref 3.5–5.1)
SODIUM: 140 mmol/L (ref 135–145)
TOTAL PROTEIN: 6.6 g/dL (ref 6.5–8.1)
Total Bilirubin: 0.4 mg/dL (ref 0.3–1.2)

## 2015-04-22 LAB — CBC WITH DIFFERENTIAL/PLATELET
BASOS ABS: 0 10*3/uL (ref 0.0–0.1)
BASOS PCT: 0 % (ref 0–1)
EOS PCT: 4 % (ref 0–5)
Eosinophils Absolute: 0.2 10*3/uL (ref 0.0–0.7)
HEMATOCRIT: 29.4 % — AB (ref 36.0–46.0)
Hemoglobin: 9.2 g/dL — ABNORMAL LOW (ref 12.0–15.0)
Lymphocytes Relative: 20 % (ref 12–46)
Lymphs Abs: 1 10*3/uL (ref 0.7–4.0)
MCH: 26.5 pg (ref 26.0–34.0)
MCHC: 31.3 g/dL (ref 30.0–36.0)
MCV: 84.7 fL (ref 78.0–100.0)
Monocytes Absolute: 0.3 10*3/uL (ref 0.1–1.0)
Monocytes Relative: 7 % (ref 3–12)
NEUTROS ABS: 3.4 10*3/uL (ref 1.7–7.7)
Neutrophils Relative %: 69 % (ref 43–77)
Platelets: 260 10*3/uL (ref 150–400)
RBC: 3.47 MIL/uL — ABNORMAL LOW (ref 3.87–5.11)
RDW: 15.3 % (ref 11.5–15.5)
WBC: 5 10*3/uL (ref 4.0–10.5)

## 2015-04-22 LAB — LIPASE, BLOOD: LIPASE: 31 U/L (ref 22–51)

## 2015-04-22 LAB — URINALYSIS, ROUTINE W REFLEX MICROSCOPIC
Bilirubin Urine: NEGATIVE
GLUCOSE, UA: NEGATIVE mg/dL
Ketones, ur: NEGATIVE mg/dL
Nitrite: POSITIVE — AB
PH: 6.5 (ref 5.0–8.0)
Protein, ur: 30 mg/dL — AB
Specific Gravity, Urine: 1.008 (ref 1.005–1.030)
Urobilinogen, UA: 0.2 mg/dL (ref 0.0–1.0)

## 2015-04-22 LAB — URINE MICROSCOPIC-ADD ON

## 2015-04-22 MED ORDER — MORPHINE SULFATE 4 MG/ML IJ SOLN: 4.0000 mg | Freq: Once | INTRAMUSCULAR | Status: DC

## 2015-04-22 MED ORDER — SODIUM CHLORIDE 0.9 % IV BOLUS (SEPSIS): 500.0000 mL | Freq: Once | INTRAVENOUS | Status: DC

## 2015-04-22 MED ORDER — OXYCODONE-ACETAMINOPHEN 5-325 MG PO TABS: 1.0000 | ORAL_TABLET | Freq: Four times a day (QID) | ORAL | Status: DC | PRN

## 2015-04-22 MED ORDER — IOHEXOL 300 MG/ML  SOLN: 25.0000 mL | Freq: Once | INTRAMUSCULAR | Status: DC | PRN

## 2015-04-22 NOTE — ED Notes (Signed)
Dr.Goldston at bedside to speak with pt. Pt is refusing to have an IV at this time.

## 2015-04-22 NOTE — ED Notes (Signed)
Social worker Sales promotion account executiveWanda at bedside

## 2015-04-22 NOTE — ED Provider Notes (Addendum)
CSN: [id number]     Arrival date & time 04/22/15  1332 History   First MD Initiated Contact with Patient 04/22/15 1458     Chief Complaint  Patient presents with  . Abdominal Pain     (Consider location/radiation/quality/duration/timing/severity/associated sxs/prior Treatment) HPI  81 year old female presents with dysuria for the past 7-10 days as well as frequency and urgency. No fevers, nausea, vomiting, or new back pain. She has chronic back pain from arthritis that is not new or worse. She states she is also having left lower quadrant/pelvic abdominal pain for the past 3 days. This does not typically occur with her UTIs. She gets UTIs very frequently. Her urologist, Dr. Isabel Caprice saw her recently and does not want to prescribe an anti-biotic. According to notes from ER yesterday, this is because patient has ESBL Escherichia coli and prefers her not to be treated unless she is systemically ill given the limited options and possible more drug resistance. Patient has been taking Tylenol with no relief.  Past Medical History  Diagnosis Date  . Hypertension   . CHF (congestive heart failure)   . GERD (gastroesophageal reflux disease)   . COPD (chronic obstructive pulmonary disease)   . Asthma   . Diabetes mellitus without complication   . UTI (lower urinary tract infection)    Past Surgical History  Procedure Laterality Date  . Abdominal hysterectomy    . Appendectomy     Family History  Problem Relation Age of Onset  . Family history unknown: Yes   History  Substance Use Topics  . Smoking status: Never Smoker   . Smokeless tobacco: Not on file  . Alcohol Use: No   OB History    Gravida Para Term Preterm AB TAB SAB Ectopic Multiple Living   6 6             Review of Systems  Constitutional: Negative for fever.  Gastrointestinal: Positive for abdominal pain. Negative for nausea and vomiting.  Genitourinary: Positive for dysuria and frequency. Negative for hematuria.   Musculoskeletal: Positive for back pain.  All other systems reviewed and are negative.     Allergies  Albuterol sulfate; Penicillins; Sulfa antibiotics; and Compazine  Home Medications   Prior to Admission medications   Medication Sig Start Date End Date Taking? Authorizing Provider  allopurinol (ZYLOPRIM) 100 MG tablet Take 200 mg by mouth daily.    Historical Provider, MD  amLODipine (NORVASC) 10 MG tablet Take 10 mg by mouth daily.    Historical Provider, MD  anastrozole (ARIMIDEX) 1 MG tablet Take 1 mg by mouth daily.    Historical Provider, MD  aspirin EC 81 MG tablet Take 81 mg by mouth daily.    Historical Provider, MD  candesartan (ATACAND) 32 MG tablet Take 32 mg by mouth daily.    Historical Provider, MD  cefpodoxime (VANTIN) 100 MG tablet Take 1 tablet (100 mg total) by mouth 2 (two) times daily. 03/28/15   Mirian Mo, MD  Cholecalciferol (VITAMIN D-3 PO) Take 1 tablet by mouth daily.    Historical Provider, MD  cloNIDine (CATAPRES) 0.2 MG tablet Take 0.2 mg by mouth 2 (two) times daily.    Historical Provider, MD  dicyclomine (BENTYL) 20 MG tablet Take 20 mg by mouth daily as needed for spasms.    Historical Provider, MD  hydrALAZINE (APRESOLINE) 100 MG tablet Take 100 mg by mouth 3 (three) times daily.    Historical Provider, MD  ketoconazole (NIZORAL) 2 % cream Apply 1 application  topically at bedtime. 12/21/14   Historical Provider, MD  levalbuterol Pauline Aus) 1.25 MG/3ML nebulizer solution Inhale 1 ampule into the lungs 4 (four) times daily. 12/23/14   Historical Provider, MD  Meth-Hyo-M Salley Hews Phos-Ph Sal (URIBEL) 118 MG CAPS 1 QID prn dysuria 04/21/15   Mancel Bale, MD  ondansetron (ZOFRAN-ODT) 4 MG disintegrating tablet Take 4 mg by mouth every 8 (eight) hours as needed for nausea or vomiting.    Historical Provider, MD  pantoprazole (PROTONIX) 40 MG tablet Take 40 mg by mouth daily.    Historical Provider, MD  potassium chloride SA (K-DUR,KLOR-CON) 20 MEQ tablet Take  20 mEq by mouth daily.    Historical Provider, MD  torsemide (DEMADEX) 20 MG tablet Take 20 mg by mouth daily.    Historical Provider, MD   BP 147/83 mmHg  Pulse 64  Temp(Src) 97.8 F (36.6 C) (Oral)  Resp 18  Ht  (1.626 m)  Wt 175 lb (79.379 kg)  BMI 30.02 kg/m2  SpO2 93% Physical Exam  Constitutional: She is oriented to person, place, and time. She appears well-developed and well-nourished.  HENT:  Head: Normocephalic and atraumatic.  Right Ear: External ear normal.  Left Ear: External ear normal.  Nose: Nose normal.  Eyes: Right eye exhibits no discharge. Left eye exhibits no discharge.  Cardiovascular: Normal rate, regular rhythm and normal heart sounds.   Pulmonary/Chest: Effort normal and breath sounds normal.  Abdominal: Soft. There is tenderness in the left lower quadrant. There is no rigidity and no guarding.  Large pannus makes exam difficult. Large ventral hernia without tenderness or signs of incarceration  Neurological: She is alert and oriented to person, place, and time.  Skin: Skin is warm and dry.  Nursing note and vitals reviewed.   ED Course  Procedures (including critical care time) Labs Review Labs Reviewed  CBC WITH DIFFERENTIAL/PLATELET - Abnormal; Notable for the following:    RBC 3.47 (*)    Hemoglobin 9.2 (*)    HCT 29.4 (*)    All other components within normal limits  COMPREHENSIVE METABOLIC PANEL - Abnormal; Notable for the following:    Glucose, Bld 220 (*)    BUN 50 (*)    Creatinine, Ser 1.71 (*)    Calcium 8.6 (*)    Albumin 3.1 (*)    AST 13 (*)    ALT 7 (*)    GFR calc non Af Amer 26 (*)    GFR calc Af Amer 30 (*)    All other components within normal limits  URINALYSIS, ROUTINE W REFLEX MICROSCOPIC (NOT AT [facility]) - Abnormal; Notable for the following:    APPearance TURBID (*)    Hgb urine dipstick TRACE (*)    Protein, ur 30 (*)    Nitrite POSITIVE (*)    Leukocytes, UA LARGE (*)    All other components within normal  limits  URINE MICROSCOPIC-ADD ON - Abnormal; Notable for the following:    Bacteria, UA MANY (*)    Casts HYALINE CASTS (*)    All other components within normal limits  URINE CULTURE  LIPASE, BLOOD    Imaging Review Ct Abdomen Pelvis Wo Contrast  04/22/2015   CLINICAL DATA:  Left lower quadrant abdominal pain. Urinary tract infection.  EXAM: CT ABDOMEN AND PELVIS WITHOUT CONTRAST  TECHNIQUE: Multidetector CT imaging of the abdomen and pelvis was performed following the standard protocol without IV contrast.  COMPARISON:  None.  FINDINGS: Lower chest: Coronary atherosclerosis. Mild aortic and mitral valve  calcification. Low-density blood pool. Mild cardiomegaly.  Small to moderate right pleural effusion, nonspecific for transudative versus exudative etiology.  6 mm left lower lobe pulmonary nodule, image 3 series 3.  Hepatobiliary: Small densities in the contracted gallbladder, suspicious for gallstones.  Pancreas: Unremarkable  Spleen: Unremarkable  Adrenals/Urinary Tract: Right kidney upper pole exophytic hypodense 1.6 cm lesion, internal density -6 Hounsfield units. Left anterior mid kidney 1.6 hyper dense lesion, internal density 56 Hounsfield units. No hydronephrosis or hydroureter. Vascular calcifications in both renal hila.  Stomach/Bowel: Sigmoid diverticulosis.  Vascular/Lymphatic: Dense aortic and branch vessel atherosclerotic vascular disease. Left common iliac node 1.0 cm in short axis, borderline enlarged.  Reproductive: Uterus absent.  Ovaries not well seen.  Other: No supplemental non-categorized findings.  Musculoskeletal: Ventral supraumbilical hernia measuring 6.1 by 6.3 by 2.9 cm contains fluid and fat density. No herniated bowel. There is also a right paracentral ventral hernia at the level of the umbilicus containing only adipose tissue.  Lumbar spondylosis and degenerative disc disease with grade 1 retrolisthesis at L3-4 and grade 1 anterolisthesis at L4-5. Endplate irregularity is  likely due to Schmorl' s nodes and degenerative endplate findings at the L3-4 level. Left lateral recess foraminal disc protrusion at L2-3; left paracentral disc protrusion at L3-4 ; extensive facet arthropathy at L4-5, with suspected impingement at each of these levels.  IMPRESSION: 1. Supraumbilical hernia contains adipose tissue and fluid, and could represent inflamed or ischemic herniated omental adipose tissue. Just below this there is another right paracentral ventral hernia containing only adipose tissue. 2. Abnormal fluid/thickening along the posterior margin of the left inferior rectus abdominus muscle, possibly postoperative although a small hematoma in this vicinity is not excluded. 3. Hyperdense left anterior mid kidney lesion, probably a complex cyst although mass is not excluded given the lack of IV contrast. 4. Extensive atherosclerosis including the coronary arteries, with aortic and mitral valve calcification as well. 5. Low-density blood pool suggests anemia. 6. Mild cardiomegaly. 7. Small to moderate right pleural effusion. 8. 6 mm left lower lobe pulmonary nodule. If the patient is at high risk for bronchogenic carcinoma, follow-up chest CT at 6-12 months is recommended. If the patient is at low risk for bronchogenic carcinoma, follow-up chest CT at 12 months is recommended. This recommendation follows the consensus statement: Guidelines for Management of Small Pulmonary Nodules Detected on CT Scans: A Statement from the Fleischner Society as published in Radiology 2005;237:395-400. 9. Sigmoid diverticulosis without active diverticulitis. 10. Lumbar spondylosis and degenerative disc disease causing lower lumbar impingement. 11. Cholelithiasis   Electronically Signed   By: Gaylyn RongWalter  Liebkemann M.D.   On: 04/22/2015 16:37     EKG Interpretation None      MDM   Final diagnoses:  Abdominal wall pain  Abdominal wall hernia  Dysuria    No obvious cause for the patient's left lower  quadrant abdominal pain. No diverticulitis. She is tender over her abdominal wall and with this hematoma could've had a bruise or injury. Does not appear significant. I discussed the abnormal findings that were also seen on CT such as the kidney cyst/lesion which the family has known about and had worked up in the past. They also know about the pulmonary nodule and understand follow-up for this as well. Patient does have an abdominal wall hernia but on repeat exam is still not tender there, I have very low suspicion for a incarcerated hernia. As for her urine, it seems that she has a chronic ESBL infection. Dr. Isabel CapriceGrapey does  not want to be treating this unless she is systemically ill as it is very resistant. Family seems to understand this when asked biotin but are requesting a repeat culture to confirm this. Will do this, will need close follow-up with her PCP. I have asked social worker to see patient to help facilitate finding a PCP is she still has not found one due to insurance issues.    Pricilla Loveless, MD 04/22/15 1610  Pricilla Loveless, MD 04/22/15 [phone]

## 2015-04-22 NOTE — Care Management (Signed)
ED CM consulted by Dr. Criss AlvineGoldston regarding follow up care, [place] issues, and has had 4 ED visits in the past 6 months. Patient lives [place] in [place]. Medicare Payor source. [facility] for [facility]CP, patient and daughter agreeable offered to assist with appt.[facility] at the [facility]11 at 10:45am [facility] care coordination [facility] which is available for Medicare beneficiaries referral was placed, explained that [facility] will contact patient and family at home at the verified number in record 1-3 days after discharge. Patient and daughter are agreeable to disposition plan, and appreciative for the assistance. No Further ED CM needs identified

## 2015-04-22 NOTE — Discharge Instructions (Signed)
Abdominal Pain °Many things can cause abdominal pain. Usually, abdominal pain is not caused by a disease and will improve without treatment. It can often be observed and treated at home. Your health care provider will do a physical exam and possibly order blood tests and X-rays to help determine the seriousness of your pain. However, in many cases, more time must pass before a clear cause of the pain can be found. Before that point, your health care provider may not know if you need more testing or further treatment. °HOME CARE INSTRUCTIONS  °Monitor your abdominal pain for any changes. The following actions may help to alleviate any discomfort you are experiencing: °· Only take over-the-counter or prescription medicines as directed by your health care provider. °· Do not take laxatives unless directed to do so by your health care provider. °· Try a clear liquid diet (broth, tea, or water) as directed by your health care provider. Slowly move to a bland diet as tolerated. °SEEK MEDICAL CARE IF: °· You have unexplained abdominal pain. °· You have abdominal pain associated with nausea or diarrhea. °· You have pain when you urinate or have a bowel movement. °· You experience abdominal pain that wakes you in the night. °· You have abdominal pain that is worsened or improved by eating food. °· You have abdominal pain that is worsened with eating fatty foods. °· You have a fever. °SEEK IMMEDIATE MEDICAL CARE IF:  °· Your pain does not go away within 2 hours. °· You keep throwing up (vomiting). °· Your pain is felt only in portions of the abdomen, such as the right side or the left lower portion of the abdomen. °· You pass bloody or black tarry stools. °MAKE SURE YOU: °· Understand these instructions.   °· Will watch your condition.   °· Will get help right away if you are not doing well or get worse.   °Document Released: 07/21/2005 Document Revised: 10/16/2013 Document Reviewed: 06/20/2013 °ExitCare® Patient Information  ©2015 [employer]. This information is not intended to replace advice given to you by your health care provider. Make sure you discuss any questions you have with your health care provider. ° ° ° ° °Dysuria °Dysuria is the medical term for pain with urination. There are many causes for dysuria, but urinary tract infection is the most common. If a urinalysis was performed it can show that there is a urinary tract infection. A urine culture confirms that you or your child is sick. You will need to follow up with a healthcare provider because: °· If a urine culture was done you will need to know the culture results and treatment recommendations. °· If the urine culture was positive, you or your child will need to be put on antibiotics or know if the antibiotics prescribed are the right antibiotics for your urinary tract infection. °· If the urine culture is negative (no urinary tract infection), then other causes may need to be explored or antibiotics need to be stopped. °Today laboratory work may have been done and there does not seem to be an infection. If cultures were done they will take at least 24 to 48 hours to be completed. °Today x-rays may have been taken and they read as normal. No cause can be found for the problems. The x-rays may be re-read by a radiologist and you will be contacted if additional findings are made. °You or your child may have been put on medications to help with this problem until you can see your   primary caregiver. If the problems get better, see your primary caregiver if the problems return. If you were given antibiotics (medications which kill germs), take all of the mediations as directed for the full course of treatment.  °If laboratory work was done, you need to find the results. Leave a telephone number where you can be reached. If this is not possible, make sure you find out how you are to get test results. °HOME CARE INSTRUCTIONS  °· Drink lots of fluids. For adults, drink  eight, 8 ounce glasses of clear juice or water a day. For children, replace fluids as suggested by your caregiver. °· Empty the bladder often. Avoid holding urine for long periods of time. °· After a bowel movement, women should cleanse front to back, using each tissue only once. °· Empty your bladder before and after sexual intercourse. °· Take all the medicine given to you until it is gone. You may feel better in a few days, but TAKE ALL MEDICINE. °· Avoid caffeine, tea, alcohol and carbonated beverages, because they tend to irritate the bladder. °· In men, alcohol may irritate the prostate. °· Only take over-the-counter or prescription medicines for pain, discomfort, or fever as directed by your caregiver. °· If your caregiver has given you a follow-up appointment, it is very important to keep that appointment. Not keeping the appointment could result in a chronic or permanent injury, pain, and disability. If there is any problem keeping the appointment, you must call back to this facility for assistance. °SEEK IMMEDIATE MEDICAL CARE IF:  °· Back pain develops. °· A fever develops. °· There is nausea (feeling sick to your stomach) or vomiting (throwing up). °· Problems are no better with medications or are getting worse. °MAKE SURE YOU:  °· Understand these instructions. °· Will watch your condition. °· Will get help right away if you are not doing well or get worse. °Document Released: 07/09/2004 Document Revised: 01/03/2012 Document Reviewed: 05/16/2008 °ExitCare® Patient Information ©2015 [employer]. This information is not intended to replace advice given to you by your health care provider. Make sure you discuss any questions you have with your health care provider. ° °

## 2015-04-23 ENCOUNTER — Telehealth: Payer: Self-pay

## 2015-04-23 NOTE — Telephone Encounter (Signed)
Received a message  from Michel BickersWandalyn Rogers, CM for  a possible TCC referral   I called and spoke to the patient.  She put the phone on speaker to allow her daughter, Malachi BondsGloria, to participate in the conversation.  This CM started to explain the [facility] and Malachi BondsGloria stated that they don't want to begin any services.  She said that her mother has medicare and she can find a doctor that will see her, she is most interested in having help at home for her mother so that she can work.  Malachi BondsGloria explained that her mother can't walk and needs to have someone with her to provide her care. She said that [place] her mother received 39 hrs /week of aide services through a medicaid waiver program.  She said that she is aware that there is not a similar program here and she also noted that her mother is over income for medicaid in [place]. This CM explained the [facility] services but it is necessary to have medicaid to qualify for those services and there is no guarantee that she would qualify for 39 hrs/week. This CM also explained that the [facility] does not provide the aide services; but can assist with identifying resources.     She said that they had a referral for home care services and the agency would only provide 2 hrs./ week of aide services; but they need more than that because she Malachi Bonds( Gloria) needs to work.    This CM explained that medicare regulations limit how may hours of a home health aide can be provided and it is only a short term service.    Private pay services were discussed as an option but the cost is prohibitive.  Malachi BondsGloria said that she also has a young son to care for and she needs to work.  The option of day programs were also discussed and Malachi BondsGloria stated that she has inquired about those types of programs , ie - PACE program and her mother does not have medicaid, there would be an out of pocket expense and there is a 2 1/2 yr wait for the program.   This CM  offered to have the [facility] contact her to discuss any other options and Malachi BondsGloria stated that she did want want to talk to the SW.  She was not interested in any programs offered at the [facility] at this time.  This CM spoke to [facility] Rogers, CM and updated her on the status of this referral and noted that they do not wish to begin any services with [facility] at this time.

## 2015-04-24 ENCOUNTER — Telehealth: Payer: Self-pay

## 2015-04-24 ENCOUNTER — Telehealth (HOSPITAL_BASED_OUTPATIENT_CLINIC_OR_DEPARTMENT_OTHER): Payer: Self-pay | Admitting: Emergency Medicine

## 2015-04-24 LAB — URINE CULTURE: Culture: >=100000

## 2015-04-24 NOTE — Telephone Encounter (Signed)
This Case Manager case conferenced with Robyne PeersJane Brazeau, RN CM who indicated she spoke with patient's daughter, Malachi BondsGloria, about patient's need for caregiver services. Erskine SquibbJane indicated she spoke with Hulda MarinJamie McMannes, SW to determine if there were any additional caregiver resources that patient could be given.  Hulda MarinJamie McMannes, SW did not identify any additional resources. Call placed to patient or patient's daughter, Malachi BondsGloria, to inform. Unable to reach; voicemail left requesting return call.

## 2015-04-26 ENCOUNTER — Telehealth: Payer: Self-pay | Admitting: *Deleted

## 2015-04-26 NOTE — ED Notes (Signed)
(+)  urine culture, treated by urologist recently and declined antibiotic treatment at time of visit per M. Roselind MessierMaccia, Pharm

## 2015-04-27 ENCOUNTER — Emergency Department (HOSPITAL_COMMUNITY): Payer: Medicare Other

## 2015-04-27 ENCOUNTER — Emergency Department (HOSPITAL_COMMUNITY)
Admission: EM | Admit: 2015-04-27 | Discharge: 2015-04-27 | Disposition: A | Discharge status: Home / Self Care | Payer: Medicare Other | Attending: Emergency Medicine | Admitting: Emergency Medicine

## 2015-04-27 ENCOUNTER — Encounter (HOSPITAL_COMMUNITY): Payer: Self-pay

## 2015-04-27 DIAGNOSIS — K219 Gastro-esophageal reflux disease without esophagitis: Secondary | ICD-10-CM | POA: Insufficient documentation

## 2015-04-27 DIAGNOSIS — R109 Unspecified abdominal pain: Secondary | ICD-10-CM | POA: Diagnosis present

## 2015-04-27 DIAGNOSIS — R110 Nausea: Secondary | ICD-10-CM | POA: Diagnosis not present

## 2015-04-27 DIAGNOSIS — I509 Heart failure, unspecified: Secondary | ICD-10-CM | POA: Insufficient documentation

## 2015-04-27 DIAGNOSIS — J449 Chronic obstructive pulmonary disease, unspecified: Secondary | ICD-10-CM | POA: Diagnosis not present

## 2015-04-27 DIAGNOSIS — I10 Essential (primary) hypertension: Secondary | ICD-10-CM | POA: Insufficient documentation

## 2015-04-27 DIAGNOSIS — E119 Type 2 diabetes mellitus without complications: Secondary | ICD-10-CM | POA: Insufficient documentation

## 2015-04-27 DIAGNOSIS — Z7982 Long term (current) use of aspirin: Secondary | ICD-10-CM | POA: Insufficient documentation

## 2015-04-27 DIAGNOSIS — N390 Urinary tract infection, site not specified: Secondary | ICD-10-CM | POA: Insufficient documentation

## 2015-04-27 DIAGNOSIS — Z90710 Acquired absence of both cervix and uterus: Secondary | ICD-10-CM | POA: Insufficient documentation

## 2015-04-27 DIAGNOSIS — N289 Disorder of kidney and ureter, unspecified: Secondary | ICD-10-CM | POA: Diagnosis not present

## 2015-04-27 DIAGNOSIS — Z79899 Other long term (current) drug therapy: Secondary | ICD-10-CM | POA: Insufficient documentation

## 2015-04-27 DIAGNOSIS — Z880 Allergy status to penicillin: Secondary | ICD-10-CM | POA: Diagnosis not present

## 2015-04-27 LAB — URINALYSIS, ROUTINE W REFLEX MICROSCOPIC
Bilirubin Urine: NEGATIVE
Glucose, UA: NEGATIVE mg/dL
Hgb urine dipstick: NEGATIVE
Ketones, ur: NEGATIVE mg/dL
NITRITE: POSITIVE — AB
PROTEIN: 100 mg/dL — AB
Specific Gravity, Urine: 1.011 (ref 1.005–1.030)
Urobilinogen, UA: 0.2 mg/dL (ref 0.0–1.0)
pH: 6 (ref 5.0–8.0)

## 2015-04-27 LAB — COMPREHENSIVE METABOLIC PANEL
ALT: 7 U/L — ABNORMAL LOW (ref 14–54)
AST: 13 U/L — AB (ref 15–41)
Albumin: 3.2 g/dL — ABNORMAL LOW (ref 3.5–5.0)
Alkaline Phosphatase: 45 U/L (ref 38–126)
Anion gap: 9 (ref 5–15)
BUN: 46 mg/dL — AB (ref 6–20)
CHLORIDE: 101 mmol/L (ref 101–111)
CO2: 29 mmol/L (ref 22–32)
Calcium: 8.6 mg/dL — ABNORMAL LOW (ref 8.9–10.3)
Creatinine, Ser: 1.62 mg/dL — ABNORMAL HIGH (ref 0.44–1.00)
GFR calc non Af Amer: 28 mL/min — ABNORMAL LOW (ref >60)
GFR, EST AFRICAN AMERICAN: 33 mL/min — AB (ref >60)
Glucose, Bld: 149 mg/dL — ABNORMAL HIGH (ref 65–99)
Potassium: 4.1 mmol/L (ref 3.5–5.1)
Sodium: 139 mmol/L (ref 135–145)
Total Bilirubin: 0.4 mg/dL (ref 0.3–1.2)
Total Protein: 6.3 g/dL — ABNORMAL LOW (ref 6.5–8.1)

## 2015-04-27 LAB — CBC
HCT: 29.2 % — ABNORMAL LOW (ref 36.0–46.0)
HEMOGLOBIN: 9.1 g/dL — AB (ref 12.0–15.0)
MCH: 27.2 pg (ref 26.0–34.0)
MCHC: 31.2 g/dL (ref 30.0–36.0)
MCV: 87.2 fL (ref 78.0–100.0)
Platelets: 254 10*3/uL (ref 150–400)
RBC: 3.35 MIL/uL — ABNORMAL LOW (ref 3.87–5.11)
RDW: 15.1 % (ref 11.5–15.5)
WBC: 5.6 10*3/uL (ref 4.0–10.5)

## 2015-04-27 LAB — LIPASE, BLOOD: LIPASE: 27 U/L (ref 22–51)

## 2015-04-27 LAB — URINE MICROSCOPIC-ADD ON

## 2015-04-27 MED ORDER — OXYCODONE-ACETAMINOPHEN 5-325 MG PO TABS: 1.0000 | ORAL_TABLET | Freq: Four times a day (QID) | ORAL | Status: DC | PRN

## 2015-04-27 MED ORDER — ONDANSETRON 4 MG PO TBDP: 4.0000 mg | ORAL_TABLET | Freq: Three times a day (TID) | ORAL | Status: AC | PRN

## 2015-04-27 MED ORDER — SODIUM CHLORIDE 0.9 % IV BOLUS (SEPSIS)
1000.0000 mL | Freq: Once | INTRAVENOUS | Status: AC
  Administered 2015-04-27: 1000 mL via INTRAVENOUS

## 2015-04-27 NOTE — Discharge Instructions (Signed)
Return to the ED with any concerns including vomiting and not able to keep down liquids, fever/chills, worsening abdominal pain, decreased level of alertness/lethargy, or any other alarming symptoms  You should take your zofran every 6 hours if you are feeling nauseated, please increase you fluid intake, and continue taking the antibiotics as prescribed until they are finished

## 2015-04-27 NOTE — ED Provider Notes (Signed)
CSN: [id number]     Arrival date & time 04/27/15  1406 History   First MD Initiated Contact with Patient 04/27/15 1507     Chief Complaint  Patient presents with  . Abdominal Pain  . Nausea  . Constipation     (Consider location/radiation/quality/duration/timing/severity/associated sxs/prior Treatment) HPI  Pt presenting with c/o nausea and diffuse abdominal pain with constipation.  She has hx of ESBL resistant UTI and was recently started on macrobid 3 days ago for UTI.  Since that time she states she has been having nausea but no vomiting and ongoing abdominal pain.  She states her last BM was 4 days ago, this is her normal pattern.  No fever/chills.  She took zofran this morning approx 5am, has not taken again.  Her daughter states she does not drink much liquids at baseline.  She also c/o some increased shortness of breath and cough - states this feels like her usual COPD.  There are no other associated systemic symptoms, there are no other alleviating or modifying factors.   Past Medical History  Diagnosis Date  . Hypertension   . CHF (congestive heart failure)   . GERD (gastroesophageal reflux disease)   . COPD (chronic obstructive pulmonary disease)   . Asthma   . Diabetes mellitus without complication   . UTI (lower urinary tract infection)    Past Surgical History  Procedure Laterality Date  . Abdominal hysterectomy    . Appendectomy     Family History  Problem Relation Age of Onset  . Family history unknown: Yes   History  Substance Use Topics  . Smoking status: Never Smoker   . Smokeless tobacco: Not on file  . Alcohol Use: No   OB History    Gravida Para Term Preterm AB TAB SAB Ectopic Multiple Living   6 6             Review of Systems  ROS reviewed and all otherwise negative except for mentioned in HPI    Allergies  Albuterol sulfate; Penicillins; Sulfa antibiotics; and Compazine  Home Medications   Prior to Admission medications   Medication Sig  Start Date End Date Taking? Authorizing Provider  amLODipine (NORVASC) 10 MG tablet Take 10 mg by mouth daily.   Yes Historical Provider, MD  anastrozole (ARIMIDEX) 1 MG tablet Take 1 mg by mouth daily.   Yes Historical Provider, MD  aspirin EC 81 MG tablet Take 81 mg by mouth daily.   Yes Historical Provider, MD  candesartan (ATACAND) 32 MG tablet Take 32 mg by mouth daily.   Yes Historical Provider, MD  Cholecalciferol (VITAMIN D-3 PO) Take 1 tablet by mouth daily.   Yes Historical Provider, MD  cloNIDine (CATAPRES) 0.2 MG tablet Take 0.2 mg by mouth 2 (two) times daily.   Yes Historical Provider, MD  dicyclomine (BENTYL) 20 MG tablet Take 20 mg by mouth daily as needed for spasms.   Yes Historical Provider, MD  hydrALAZINE (APRESOLINE) 100 MG tablet Take 100 mg by mouth 3 (three) times daily.   Yes Historical Provider, MD  ketoconazole (NIZORAL) 2 % cream Apply 1 application topically at bedtime. 12/21/14  Yes Historical Provider, MD  levalbuterol Pauline Aus) 1.25 MG/3ML nebulizer solution Inhale 1 ampule into the lungs 4 (four) times daily. 12/23/14  Yes Historical Provider, MD  MONUROL 3 G PACK Take 1 Package by mouth once. 04/02/15  Yes Historical Provider, MD  nitrofurantoin, macrocrystal-monohydrate, (MACROBID) 100 MG capsule Take 100 mg by mouth 2 (two)  times daily.   Yes Historical Provider, MD  oxyCODONE (OXY IR/ROXICODONE) 5 MG immediate release tablet Take 0.5 tablets by mouth 2 (two) times daily as needed. pain 04/13/15  Yes Historical Provider, MD  pantoprazole (PROTONIX) 40 MG tablet Take 40 mg by mouth daily.   Yes Historical Provider, MD  potassium chloride SA (K-DUR,KLOR-CON) 20 MEQ tablet Take 20 mEq by mouth daily.   Yes Historical Provider, MD  torsemide (DEMADEX) 20 MG tablet Take 20 mg by mouth daily.   Yes Historical Provider, MD  cefpodoxime (VANTIN) 100 MG tablet Take 1 tablet (100 mg total) by mouth 2 (two) times daily. Patient not taking: Reported on 04/22/2015 03/28/15   Mirian MoMatthew  Gentry, MD  Meth-Hyo-M Bl-Na Phos-Ph Sal (URIBEL) 118 MG CAPS 1 QID prn dysuria Patient not taking: Reported on 04/27/2015 04/21/15   Mancel BaleElliott Wentz, MD  ondansetron (ZOFRAN ODT) 4 MG disintegrating tablet Take 1 tablet (4 mg total) by mouth every 8 (eight) hours as needed for nausea or vomiting. 04/27/15   Jerelyn ScottMartha Linker, MD  oxyCODONE-acetaminophen (PERCOCET/ROXICET) 5-325 MG per tablet Take 1-2 tablets by mouth every 6 (six) hours as needed for severe pain. 04/27/15   Jerelyn ScottMartha Linker, MD   BP 164/58 mmHg  Pulse 64  Temp(Src) 97.8 F (36.6 C) (Oral)  Resp 20  SpO2 100%  Vitals reviewed Physical Exam  Physical Examination: General appearance - alert, well appearing, and in no distress Mental status - alert, oriented to person, place, and time Eyes - no conjunctival injections, no scleral icterus Mouth - mucous membranes moist, pharynx normal without lesions Chest - clear to auscultation, no wheezes, rales or rhonchi, symmetric air entry Heart - normal rate, regular rhythm, normal S1, S2, no murmurs, rubs, clicks or gallops Abdomen - soft, mild diffuse tenderness, nabs, nondistended, no masses or organomegaly Neurological - alert, oriented, normal speech, strength 5/5 in extremities x 4, sensation intact Extremities - peripheral pulses normal, no pedal edema, no clubbing or cyanosis Skin - normal coloration and turgor, no rashes  ED Course  Procedures (including critical care time) Labs Review Labs Reviewed  CBC - Abnormal; Notable for the following:    RBC 3.35 (*)    Hemoglobin 9.1 (*)    HCT 29.2 (*)    All other components within normal limits  COMPREHENSIVE METABOLIC PANEL - Abnormal; Notable for the following:    Glucose, Bld 149 (*)    BUN 46 (*)    Creatinine, Ser 1.62 (*)    Calcium 8.6 (*)    Total Protein 6.3 (*)    Albumin 3.2 (*)    AST 13 (*)    ALT 7 (*)    GFR calc non Af Amer 28 (*)    GFR calc Af Amer 33 (*)    All other components within normal limits   URINALYSIS, ROUTINE W REFLEX MICROSCOPIC (NOT AT [facility]) - Abnormal; Notable for the following:    Color, Urine YELLOW (*)    APPearance CLOUDY (*)    Protein, ur 100 (*)    Nitrite POSITIVE (*)    Leukocytes, UA LARGE (*)    All other components within normal limits  URINE MICROSCOPIC-ADD ON - Abnormal; Notable for the following:    Bacteria, UA MANY (*)    Casts HYALINE CASTS (*)    All other components within normal limits  URINE CULTURE  LIPASE, BLOOD    Imaging Review Dg Abd Acute W/chest  04/27/2015   CLINICAL DATA:  Abdominal pain and nausea.  Constipation. Shortness of breath.  EXAM: DG ABDOMEN ACUTE W/ 1V CHEST  COMPARISON:  CT scan dated 04/22/2015  FINDINGS: There is a small right pleural effusion. There is cardiomegaly. Pulmonary vascularity is normal.  No free air in the abdomen. Bowel gas pattern is normal. Contrast from previous CT scan is now in the distal colon. Extensive vascular calcification in the abdomen. Diffuse degenerative changes in the lumbar spine.  IMPRESSION: No acute abnormalities of the abdomen. Right pleural effusion, unchanged since the prior exam.   Electronically Signed   By: Francene Boyers M.D.   On: 04/27/2015 16:43     EKG Interpretation None      MDM   Final diagnoses:  Nausea  UTI (lower urinary tract infection)  Renal insufficiency    Pt presenting with c/o abdominal pain and nausea associated with being on macrobid for uti.  She was seen in the ED several days ago and had normal CT scan of her abdomen and pelvis at that time.  Workup today including acute abdominal series is reassuring, her renal function is at her baseline.  I have encouraged her to increase her hdyration, continue the macrobid until it is finished and take zofran every 6 hours if she is feeling nauseated.  Discharged with strict return precautions.  Pt agreeable with plan.  \  Jerelyn Scott, MD 04/27/15 [phone]

## 2015-04-27 NOTE — ED Notes (Signed)
Patient states she is on Macrobid for UTI-states she has been nauseated since starting and wonders if this could be the cause of her nausea and abdominal cramping

## 2015-04-27 NOTE — ED Notes (Signed)
Pt c/o generalized abdominal pain and nausea x 1 day and constipation x 3 days.  Pain score 10/10.  Pt has not taken anything for constipation.  Pt was diagnosed w/ an UTI x 3-4 days ago and started on Macrobid yesterday.

## 2015-04-30 ENCOUNTER — Encounter (HOSPITAL_COMMUNITY): Payer: Self-pay | Admitting: Emergency Medicine

## 2015-04-30 ENCOUNTER — Emergency Department (HOSPITAL_COMMUNITY)
Admission: EM | Admit: 2015-04-30 | Discharge: 2015-04-30 | Disposition: A | Discharge status: Home / Self Care | Payer: Medicare Other | Attending: Emergency Medicine | Admitting: Emergency Medicine

## 2015-04-30 ENCOUNTER — Emergency Department (HOSPITAL_COMMUNITY): Payer: Medicare Other

## 2015-04-30 ENCOUNTER — Telehealth (HOSPITAL_BASED_OUTPATIENT_CLINIC_OR_DEPARTMENT_OTHER): Payer: Self-pay | Admitting: Emergency Medicine

## 2015-04-30 DIAGNOSIS — E119 Type 2 diabetes mellitus without complications: Secondary | ICD-10-CM | POA: Insufficient documentation

## 2015-04-30 DIAGNOSIS — Z880 Allergy status to penicillin: Secondary | ICD-10-CM | POA: Insufficient documentation

## 2015-04-30 DIAGNOSIS — I10 Essential (primary) hypertension: Secondary | ICD-10-CM | POA: Diagnosis not present

## 2015-04-30 DIAGNOSIS — N390 Urinary tract infection, site not specified: Secondary | ICD-10-CM | POA: Insufficient documentation

## 2015-04-30 DIAGNOSIS — K219 Gastro-esophageal reflux disease without esophagitis: Secondary | ICD-10-CM | POA: Diagnosis not present

## 2015-04-30 DIAGNOSIS — I509 Heart failure, unspecified: Secondary | ICD-10-CM | POA: Insufficient documentation

## 2015-04-30 DIAGNOSIS — Z7982 Long term (current) use of aspirin: Secondary | ICD-10-CM | POA: Diagnosis not present

## 2015-04-30 DIAGNOSIS — J159 Unspecified bacterial pneumonia: Secondary | ICD-10-CM | POA: Insufficient documentation

## 2015-04-30 DIAGNOSIS — J441 Chronic obstructive pulmonary disease with (acute) exacerbation: Secondary | ICD-10-CM | POA: Diagnosis not present

## 2015-04-30 DIAGNOSIS — Z79899 Other long term (current) drug therapy: Secondary | ICD-10-CM | POA: Insufficient documentation

## 2015-04-30 DIAGNOSIS — J189 Pneumonia, unspecified organism: Secondary | ICD-10-CM

## 2015-04-30 DIAGNOSIS — R0602 Shortness of breath: Secondary | ICD-10-CM | POA: Diagnosis present

## 2015-04-30 LAB — CBC WITH DIFFERENTIAL/PLATELET
Basophils Absolute: 0 10*3/uL (ref 0.0–0.1)
Basophils Relative: 1 % (ref 0–1)
Eosinophils Absolute: 0.5 10*3/uL (ref 0.0–0.7)
Eosinophils Relative: 10 % — ABNORMAL HIGH (ref 0–5)
HEMATOCRIT: 29.4 % — AB (ref 36.0–46.0)
Hemoglobin: 9 g/dL — ABNORMAL LOW (ref 12.0–15.0)
LYMPHS PCT: 21 % (ref 12–46)
Lymphs Abs: 1.1 10*3/uL (ref 0.7–4.0)
MCH: 26.2 pg (ref 26.0–34.0)
MCHC: 30.6 g/dL (ref 30.0–36.0)
MCV: 85.7 fL (ref 78.0–100.0)
Monocytes Absolute: 0.3 10*3/uL (ref 0.1–1.0)
Monocytes Relative: 5 % (ref 3–12)
NEUTROS ABS: 3.3 10*3/uL (ref 1.7–7.7)
NEUTROS PCT: 63 % (ref 43–77)
PLATELETS: 223 10*3/uL (ref 150–400)
RBC: 3.43 MIL/uL — ABNORMAL LOW (ref 3.87–5.11)
RDW: 15.3 % (ref 11.5–15.5)
WBC: 5.2 10*3/uL (ref 4.0–10.5)

## 2015-04-30 LAB — BASIC METABOLIC PANEL
Anion gap: 12 (ref 5–15)
BUN: 45 mg/dL — ABNORMAL HIGH (ref 6–20)
CALCIUM: 8.8 mg/dL — AB (ref 8.9–10.3)
CO2: 26 mmol/L (ref 22–32)
Chloride: 102 mmol/L (ref 101–111)
Creatinine, Ser: 1.6 mg/dL — ABNORMAL HIGH (ref 0.44–1.00)
GFR, EST AFRICAN AMERICAN: 33 mL/min — AB (ref >60)
GFR, EST NON AFRICAN AMERICAN: 28 mL/min — AB (ref >60)
GLUCOSE: 173 mg/dL — AB (ref 65–99)
Potassium: 3.5 mmol/L (ref 3.5–5.1)
SODIUM: 140 mmol/L (ref 135–145)

## 2015-04-30 LAB — I-STAT CG4 LACTIC ACID, ED: Lactic Acid, Venous: 1.33 mmol/L (ref 0.5–2.0)

## 2015-04-30 MED ORDER — MOXIFLOXACIN HCL 400 MG PO TABS: 400.0000 mg | ORAL_TABLET | Freq: Every day | ORAL | Status: DC

## 2015-04-30 NOTE — Progress Notes (Signed)
Patient noted to be seen int he ED six times within the last six months.  This EDCM has spoken to patient and her daughter regarding home health services on 06/03, patient and duaghter refused home health services at that time.  Patient's daughter reported it was not helpful at all.  This EDCM provided patient and her daughter with list of private duty nursing agencies. [facility] am CM spoke to patient and her daughter on 06/27 and reviewed in detail all resources available to them.  This patient was also [facility] pm CM on 06/28 and arranged [facility] and [facility] at 1045am per chart review.

## 2015-04-30 NOTE — ED Notes (Signed)
Patient's family is very rude and does not want patient on the bed to examined or placed in a gown.

## 2015-04-30 NOTE — ED Provider Notes (Signed)
CSN: [id number]     Arrival date & time 04/30/15  1506 History   First MD Initiated Contact with Patient 04/30/15 1627     Chief Complaint  Patient presents with  . Shortness of Breath  . Back Pain  . Known UTI      (Consider location/radiation/quality/duration/timing/severity/associated sxs/prior Treatment) HPI   Leslie Parrish is a 81 y.o. female who presents for evaluation of cough for several days. She is currently being treated with Macrobid for a UTI, with a resistant organism. She does not have a primary care doctor. She recently moved here from [place]. She has had multiple ED visits. She's not had fever, vomiting, weakness or dizziness. She uses nebulizers at home. She does not smoke cigarettes. There are no other known modifying factors.   Past Medical History  Diagnosis Date  . Hypertension   . CHF (congestive heart failure)   . GERD (gastroesophageal reflux disease)   . COPD (chronic obstructive pulmonary disease)   . Asthma   . Diabetes mellitus without complication   . UTI (lower urinary tract infection)    Past Surgical History  Procedure Laterality Date  . Abdominal hysterectomy    . Appendectomy     Family History  Problem Relation Age of Onset  . Family history unknown: Yes   History  Substance Use Topics  . Smoking status: Never Smoker   . Smokeless tobacco: Not on file  . Alcohol Use: No   OB History    Gravida Para Term Preterm AB TAB SAB Ectopic Multiple Living   6 6             Review of Systems  All other systems reviewed and are negative.     Allergies  Albuterol sulfate; Penicillins; Sulfa antibiotics; and Compazine  Home Medications   Prior to Admission medications   Medication Sig Start Date End Date Taking? Authorizing Provider  amLODipine (NORVASC) 10 MG tablet Take 10 mg by mouth daily.   Yes Historical Provider, MD  anastrozole (ARIMIDEX) 1 MG tablet Take 1 mg by mouth daily.   Yes Historical Provider, MD  aspirin EC 81 MG  tablet Take 81 mg by mouth daily.   Yes Historical Provider, MD  candesartan (ATACAND) 32 MG tablet Take 32 mg by mouth daily.   Yes Historical Provider, MD  Cholecalciferol (VITAMIN D-3 PO) Take 1 tablet by mouth daily.   Yes Historical Provider, MD  cloNIDine (CATAPRES) 0.2 MG tablet Take 0.2 mg by mouth 2 (two) times daily.   Yes Historical Provider, MD  dicyclomine (BENTYL) 20 MG tablet Take 20 mg by mouth daily as needed for spasms.   Yes Historical Provider, MD  hydrALAZINE (APRESOLINE) 100 MG tablet Take 100 mg by mouth 3 (three) times daily.   Yes Historical Provider, MD  ketoconazole (NIZORAL) 2 % cream Apply 1 application topically at bedtime. 12/21/14  Yes Historical Provider, MD  levalbuterol Pauline Aus) 1.25 MG/3ML nebulizer solution Inhale 1 ampule into the lungs 4 (four) times daily. 12/23/14  Yes Historical Provider, MD  nitrofurantoin, macrocrystal-monohydrate, (MACROBID) 100 MG capsule Take 100 mg by mouth 2 (two) times daily.   Yes Historical Provider, MD  ondansetron (ZOFRAN ODT) 4 MG disintegrating tablet Take 1 tablet (4 mg total) by mouth every 8 (eight) hours as needed for nausea or vomiting. 04/27/15  Yes Jerelyn Scott, MD  oxyCODONE (OXY IR/ROXICODONE) 5 MG immediate release tablet Take 0.5 tablets by mouth 2 (two) times daily as needed. pain 04/13/15  Yes Historical  Provider, MD  oxyCODONE-acetaminophen (PERCOCET/ROXICET) 5-325 MG per tablet Take 1-2 tablets by mouth every 6 (six) hours as needed for severe pain. 04/27/15  Yes Jerelyn ScottMartha Linker, MD  pantoprazole (PROTONIX) 40 MG tablet Take 40 mg by mouth daily.   Yes Historical Provider, MD  potassium chloride SA (K-DUR,KLOR-CON) 20 MEQ tablet Take 20 mEq by mouth daily.   Yes Historical Provider, MD  torsemide (DEMADEX) 20 MG tablet Take 20 mg by mouth daily.   Yes Historical Provider, MD  Meth-Hyo-M Bl-Na Phos-Ph Sal (URIBEL) 118 MG CAPS 1 QID prn dysuria Patient not taking: Reported on 04/27/2015 04/21/15   Mancel BaleElliott Donterius Filley, MD   moxifloxacin (AVELOX) 400 MG tablet Take 1 tablet (400 mg total) by mouth daily at 8 pm. 04/30/15   Mancel BaleElliott Brave Dack, MD   BP 206/82 mmHg  Pulse 66  Temp(Src) 98.6 F (37 C) (Oral)  Resp 15  SpO2 98% Physical Exam  Constitutional: She is oriented to person, place, and time. She appears well-developed and well-nourished.  HENT:  Head: Normocephalic and atraumatic.  Right Ear: External ear normal.  Left Ear: External ear normal.  Eyes: Conjunctivae and EOM are normal. Pupils are equal, round, and reactive to light.  Neck: Normal range of motion and phonation normal. Neck supple.  Cardiovascular: Normal rate, regular rhythm and normal heart sounds.   Pulmonary/Chest: Effort normal. No respiratory distress. She has no wheezes. She has no rales. She exhibits no tenderness and no bony tenderness.  Somewhat diminished air movement at the right base.  Abdominal: Soft. There is no tenderness.  Musculoskeletal: Normal range of motion.  Neurological: She is alert and oriented to person, place, and time. No cranial nerve deficit or sensory deficit. She exhibits normal muscle tone. Coordination normal.  Skin: Skin is warm, dry and intact.  Psychiatric: She has a normal mood and affect. Her behavior is normal.  Nursing note and vitals reviewed.   ED Course  Procedures (including critical care time)  Medications - No data to display  Patient Vitals for the past 24 hrs:  BP Temp Temp src Pulse Resp SpO2  04/30/15 1821 (!) 206/82 mmHg 98.6 F (37 C) Oral 66 15 98 %  04/30/15 1522 158/67 mmHg 98.7 F (37.1 C) Oral 63 20 95 %    6:37 PM Reevaluation with update and discussion. After initial assessment and treatment, an updated evaluation reveals no additional complaints. She is clinically unchanged. Repeat blood pressure was somewhat elevated, and will be repeated prior to discharge. Findings discussed with the patient, all questions answered. Quinto Tippy L    Labs Review Labs Reviewed   BASIC METABOLIC PANEL - Abnormal; Notable for the following:    Glucose, Bld 173 (*)    BUN 45 (*)    Creatinine, Ser 1.60 (*)    Calcium 8.8 (*)    GFR calc non Af Amer 28 (*)    GFR calc Af Amer 33 (*)    All other components within normal limits  CBC WITH DIFFERENTIAL/PLATELET - Abnormal; Notable for the following:    RBC 3.43 (*)    Hemoglobin 9.0 (*)    HCT 29.4 (*)    Eosinophils Relative 10 (*)    All other components within normal limits  I-STAT CG4 LACTIC ACID, ED    Imaging Review Dg Chest 2 View  04/30/2015   CLINICAL DATA:  Shortness of breath beginning at 5:00 a.m. today. History of COPD.  EXAM: CHEST  2 VIEW  COMPARISON:  Single view of the  chest 04/27/2015. CT abdomen and pelvis 04/22/2015 also reviewed.  FINDINGS: Right pleural effusion and basilar airspace disease to do not appear markedly changed. Patchy left basilar airspace disease is also stable in appearance. There is no pulmonary edema. The patient is status post CABG. Heart size is enlarged.  IMPRESSION: No marked change in right greater than left basilar airspace disease and a small right effusion. No new abnormality.   Electronically Signed   By: Drusilla Kanner M.D.   On: 04/30/2015 16:31     EKG Interpretation   Date/Time:  Wednesday April 30 2015 15:26:50 EDT Ventricular Rate:  60 PR Interval:  276 QRS Duration: 179 QT Interval:  540 QTC Calculation: 540 R Axis:   -158 Text Interpretation:  Sinus or ectopic atrial rhythm Prolonged PR interval  Nonspecific intraventricular conduction delay Baseline wander in lead(s)  II V3 No old tracing to compare Confirmed by [facility]  MD, Abrey Bradway [phone]) on  04/30/2015 4:29:18 PM      MDM   Final diagnoses:  CAP (community acquired pneumonia)    Cough secondary to pneumonia. No evidence for serious sepsis, metabolic instability or suggestion for impending vascular collapse.  Nursing Notes Reviewed/ Care Coordinated Applicable Imaging Reviewed Interpretation  of Laboratory Data incorporated into ED treatment  The patient appears reasonably screened and/or stabilized for discharge and I doubt any other medical condition or other [facility] requiring further screening, evaluation, or treatment in the ED at this time prior to discharge.  Plan: Home Medications- Avelox; Home Treatments- rest, fluids; return here if the recommended treatment, does not improve the symptoms; Recommended follow up- PCP of choice 1-2 weeks]     Mancel Bale, MD 04/30/15 1840

## 2015-04-30 NOTE — ED Notes (Signed)
Pt reports SOB related to COPD starting at 0500 this morning. Wears 2.5 L O2 per [place] at home. On the same amount of O2 at this time. Also having chronic back pain d/t arthritis "across my back." Being treated at this time for a confirmed UTI (on Macrobid). RR even/slightly labored. Specifically denies chest pain. No other c/c. Denies N/V/D/F.

## 2015-04-30 NOTE — Discharge Instructions (Signed)
Take Tylenol for fever or pain. Drink 3 or 4 glasses of water each day. Follow-up with a doctor for checkup in one or 2 weeks.    Pneumonia Pneumonia is an infection of the lungs.  CAUSES Pneumonia may be caused by bacteria or a virus. Usually, these infections are caused by breathing infectious particles into the lungs (respiratory tract). SIGNS AND SYMPTOMS   Cough.  Fever.  Chest pain.  Increased rate of breathing.  Wheezing.  Mucus production. DIAGNOSIS  If you have the common symptoms of pneumonia, your health care provider will typically confirm the diagnosis with a chest X-ray. The X-ray will show an abnormality in the lung (pulmonary infiltrate) if you have pneumonia. Other tests of your blood, urine, or sputum may be done to find the specific cause of your pneumonia. Your health care provider may also do tests (blood gases or pulse oximetry) to see how well your lungs are working. TREATMENT  Some forms of pneumonia may be spread to other people when you cough or sneeze. You may be asked to wear a mask before and during your exam. Pneumonia that is caused by bacteria is treated with antibiotic medicine. Pneumonia that is caused by the influenza virus may be treated with an antiviral medicine. Most other viral infections must run their course. These infections will not respond to antibiotics.  HOME CARE INSTRUCTIONS   Cough suppressants may be used if you are losing too much rest. However, coughing protects you by clearing your lungs. You should avoid using cough suppressants if you can.  Your health care provider may have prescribed medicine if he or she thinks your pneumonia is caused by bacteria or influenza. Finish your medicine even if you start to feel better.  Your health care provider may also prescribe an expectorant. This loosens the mucus to be coughed up.  Take medicines only as directed by your health care provider.  Do not smoke. Smoking is a common cause of  bronchitis and can contribute to pneumonia. If you are a smoker and continue to smoke, your cough may last several weeks after your pneumonia has cleared.  A cold steam vaporizer or humidifier in your room or home may help loosen mucus.  Coughing is often worse at night. Sleeping in a semi-upright position in a recliner or using a couple pillows under your head will help with this.  Get rest as you feel it is needed. Your body will usually let you know when you need to rest. PREVENTION A pneumococcal shot (vaccine) is available to prevent a common bacterial cause of pneumonia. This is usually suggested for:  People over 28 years old.  Patients on chemotherapy.  People with chronic lung problems, such as bronchitis or emphysema.  People with immune system problems. If you are over 65 or have a high risk condition, you may receive the pneumococcal vaccine if you have not received it before. In some countries, a routine influenza vaccine is also recommended. This vaccine can help prevent some cases of pneumonia.You may be offered the influenza vaccine as part of your care. If you smoke, it is time to quit. You may receive instructions on how to stop smoking. Your health care provider can provide medicines and counseling to help you quit. SEEK MEDICAL CARE IF: You have a fever. SEEK IMMEDIATE MEDICAL CARE IF:   Your illness becomes worse. This is especially true if you are elderly or weakened from any other disease.  You cannot control your cough with  suppressants and are losing sleep.  You begin coughing up blood.  You develop pain which is getting worse or is uncontrolled with medicines.  Any of the symptoms which initially brought you in for treatment are getting worse rather than better.  You develop shortness of breath or chest pain. MAKE SURE YOU:   Understand these instructions.  Will watch your condition.  Will get help right away if you are not doing well or get  worse. Document Released: 10/11/2005 Document Revised: 02/25/2014 Document Reviewed: 12/31/2010 [facility] Patient Information 2015 [place], [place]. This information is not intended to replace advice given to you by your health care provider. Make sure you discuss any questions you have with your health care provider.   Emergency Department Resource Guide 1) Find a Doctor and Pay Out of Pocket Although you won't have to find out who is covered by your insurance plan, it is a good idea to ask around and get recommendations. You will then need to call the office and see if the doctor you have chosen will accept you as a new patient and what types of options they offer for patients who are self-pay. Some doctors offer discounts or will set up payment plans for their patients who do not have insurance, but you will need to ask so you aren't surprised when you get to your appointment.  2) Contact Your Local Health Department Not all health departments have doctors that can see patients for sick visits, but many do, so it is worth a call to see if yours does. If you don't know where your local health department is, you can check in your phone book. The CDC also has a tool to help you locate your state's health department, and many state websites also have listings of all of their local health departments.  3) Find a Walk-in Clinic If your illness is not likely to be very severe or complicated, you may want to try a walk in clinic. These are popping up all over the country in pharmacies, drugstores, and shopping centers. They're usually staffed by nurse practitioners or physician assistants that have been trained to treat common illnesses and complaints. They're usually fairly quick and inexpensive. However, if you have serious medical issues or chronic medical problems, these are probably not your best option.  No Primary Care Doctor: - Call Health Connect at  [phone] - they can help you locate a primary  care doctor that  accepts your insurance, provides certain services, etc. - Physician Referral Service- [phone]  Chronic Pain Problems: Organization         Address  Phone   Notes  [facility]  [phone] Patients need to be referred by their primary care doctor.   Medication Assistance: Organization         Address  Phone   Notes  [facility] Medication [facility] [address] [place], [place] [address] [phone] --Must be a resident of [facility] -- Must have NO insurance coverage whatsoever (no Medicaid/ Medicare, etc.) -- The pt. MUST have a primary care doctor that directs their care regularly and follows them in the community   [facility]ist  [phone]   [employer]  [phone]    Agencies that provide inexpensive medical care: Organization         Address  Phone   Notes  [facility]  [phone]   [facility]    [phone])  [phone]   [facility] [address] [place], [place] [address] [phone]   [facility] [address], [place] [phone]) [phone]   Planned Parenthood    [phone]) [phone]   [facility]    [phone]   Community [facility] and [facility]  [address], [place] Phone:  [phone]) [phone], Fax:  [phone] Hours of Operation:  9 am - 6 pm, M-F.  Also accepts Medicaid/Medicare and self-pay.  [facility]  [address], [place] Phone: [phone], Fax: [phone]. Hours of Operation:  8:30 am - 5:30 pm, M-F.  Also accepts Medicaid and self-pay.  [facility] [place] [address], [place] Phone: [phone](336) [phone]   [facility] [address], [place], [place] [phone], Ext. [phone] Mondays & Thursdays: 7-9 AM.  First 15 patients are seen on a first come, first serve basis.    Medicaid-accepting [place] [facility]  Providers:  Organization         Address  Phone   Notes  [facility] [address], [place] [phone] Also accepts self-pay patients.  [facility] [address] [place], [place]  [phone]) [phone]   [facility] [address], [place] [phone]) [phone]   [facility] [address] [place] [address], [place] [phone]) [phone]   [name] [address], [place]   [phone]) [phone] Only accepts [place] Access [place] patients after they have their name applied to their card.   Self-Pay (no insurance) in [facility]:  Organization         Address  Phone   Notes  Sickle Cell Patients, [facility] [address], [place] [phone]   [facility] [address], [place] [phone]) [phone]   [facility] [place]  [address], [place] [phone]) [phone]   [facility]/Dr. Osei-Bonsu  [address] [place] [address], [place] or [id number], [place] [phone]) [phone] Phone number for both [place] and [place] locations is the same.  [facility] and [facility] [address], [place] [phone]) [phone]   [facility] [place] [address] [place] [address], [place] or [address] [phone]) [phone]   [facility] [address], [place] [phone], phone; [phone]) [phone], fax Sees patients 1st and 3rd Saturday of every month.  Must not qualify for public or private insurance (i.e. Medicaid, Medicare, [place] Health Choice, Veterans' Benefits)  Household income should be no more than 200% of the poverty level The clinic cannot treat you if you are pregnant or think you are pregnant  Sexually transmitted diseases are not treated at the clinic.    Dental Care: Organization         Address  Phone  Notes  [facility] Department of [facility] [address], [place] [phone]) [phone] Accepts children up to age 79 who are enrolled in [place] or [place] Health Choice; pregnant women with a Medicaid card; and children who have applied for Medicaid or [place] Health Choice, but were declined, whose parents can pay a reduced fee at time of service.  [facility] of [facility] [place]  [address], [place] [phone]) [phone] Accepts children up to  age 60 who are enrolled in Medicaid or [place] Health Choice; pregnant women with a Medicaid card; and children who have applied for Medicaid or [place] Health Choice, but were declined, whose parents can pay a reduced fee at time of service.  [facility]  [address] [place], [place] [phone] Patients are seen by appointment only. Walk-ins are not accepted. [facility] will see patients 69 years of age and older. Monday - Tuesday (8am-5pm) Most Wednesdays (8:30-5pm) $30 per visit, cash only  [facility] Access PRO[facility]  [address], [facility] [phone] Patients are seen by appointment only. Walk-ins are not accepted. [facility] will see patients 38 years of age and older. One Wednesday Evening (Monthly: Volunteer Based).  $30 per visit, cash only  [employer]  [phone] for adults; Children under age 55, call Graduate Pediatric Dentistry at [phone]. Children aged 55-14, please call [phone] to request a pediatric application.  Dental services are provided in all areas of dental care including fillings, crowns and bridges, complete and partial dentures, implants, gum treatment, root canals, and extractions. Preventive care is also provided. Treatment is provided to both adults and children. Patients are selected via a lottery and there is often a waiting list.   [facility] [address] [place]  [phone] [email or web address]   [facility]  [address] [place], [place] [phone], Ext. [phone] Second and Fourth Thursday of each month, opens at 6:30 AM; Clinic ends at 9 AM.  Patients are seen on a first-come first-served basis, and a limited number are seen during each clinic.   [facility]  [address] [place], [place] [phone]   Eligibility Requirements You must have lived in [place], [place], or [place] counties for at least the last three months.   You cannot be eligible for state or federal sponsored [place], including [employer], [place], or [employer].   You generally cannot be eligible for healthcare insurance through your employer.    How to apply: Eligibility screenings are held every Tuesday and Wednesday afternoon from 1:00 pm until 4:00 pm. You do not need an appointment for the interview!  [facility] [address] [place], [place] [phone]   [facility] Health Department  [phone]   [facility] Health Department  [phone]   [facility] Health Department  [phone]    Behavioral Health Resources in the Community: Intensive Outpatient Programs Organization         Address  Phone  Notes  [facility] [address] [place], [place] [phone]   [facility]patient [address] [place], [place] [phone]   [facility]: [facility] [address] [place], [place]  [phone]   [facility] [address]  [place], [place] [phone] or [phone]   Substance Abuse Resources Organization         Address  Phone  Notes  Alcohol and Drug Services  [phone]   [facility]  [phone]   [facility] [place]  [phone]   Floydene Flock  [phone]   Residential & Outpatient Substance Abuse Program  [phone]   Psychological Services Organization         Address  Phone  Notes  [facility] Health  [phone]    [facility] Services  [phone]   [facility] Mental Health [phone]  [address], [place] [phone] or [phone]    Mobile Crisis Teams Organization         Address  Phone  Notes  [facility], Mobile Crisis Care Unit  [phone]   [facility]  [address] [place], [place] [phone]   Doristine Locks [address] [place] [phone]    Self-Help/Support Groups Organization         Address  Phone             Notes  Mental Health Assoc. of  - variety of support groups  [phone]- I7437963 Call for more information  Narcotics Anonymous (NA), [facility]aring [facility] [address], [employer] [place]  2 meetings at this location   Statistician         Address  Phone  Notes  [facility] [address],    [place]  [phone]   [facility]  [address] [place] [address], [place], [place] [phone]   [facility] [address] [place], [place] [phone] Admissions: 8am-3pm M-F  [facility] [address]    [place], [place] [phone]   [facility] [address] [place], [place], [place] [phone]   [facility] [address]  [place], [place] [phone]   [facility] - Intensive Outpatient [address], [place], [place] [phone]   [facility] (Addiction Recovery [facility] [address]  [place], [place] [phone] or [phone]   [facility] (RT[facility]) [address], [place], [place] [phone] Accepts Medicaid  [facility] [place] [address],  [place] [phone] Substance Abuse/Addiction Treatment   [facility] Organization         Address  Phone  Notes  [facility]  [phone]   Angie Fava, PhD [address] [place], [place]   [phone] or [phone]    [facility] Behavioral   [address] [place], [place] [phone]   [facility] [address] [place], [place] [phone] Insurance/Medicaid/sponsorship through [facility] [address]                                    [place], [place] [phone] Therapy/tele-psych/case  [facility] [address], [place] [phone]    Dr. Lolly Mustache  [phone]   [facility]  [employer] [facility] Dept. 1) [address], [place] 2) [address] [place] 3)  [address], [place] [phone]  [phone]   [facility] Child Abuse Hotline 204-764-0189 or [phone] (After Hours)

## 2015-04-30 NOTE — ED Notes (Signed)
She is in no distress, with her skin being slightly pale, warm and dry.  She is maintaining her position of comfort, which is to remain sitting up in a wheelchair.  Her daughter and a pre-adolescent female are with her.

## 2015-04-30 NOTE — ED Notes (Signed)
[id number]

## 2015-04-30 NOTE — ED Notes (Signed)
Patient transported to X-ray 

## 2015-05-01 ENCOUNTER — Telehealth: Payer: Self-pay

## 2015-05-01 NOTE — Telephone Encounter (Signed)
Called and spoke to the patient and she called her daughter, Malachi BondsGloria, to come to the phone. This CM explained that after checking w/ the [facility], no other resources for providing 39 hrs of care/week were identified. CM also explained that if they are interested in seeing a provider at the [facility] for medical follow up, an appointment can be scheduled.  Malachi BondsGloria said that they are dealing with a lot of issues at this time including the fact  that the  landlord is evicting them from their home because they put a ramp on the house.  She noted again that she was upset with the home health provider because the help that she needs could not be provided.This CM acknowledged her feelings of frustration and again offered the option of medical [facility] at [facility].  Malachi BondsGloria then stated that they have bigger issues to address at this time and ended the call.

## 2015-05-01 NOTE — Progress Notes (Addendum)
  1054 CM was placed on hold for over 5 minutes.  CM disconnected.  Leslie Parrish returned a call to Cm to apologize for the long hold. CM discussed with Leslie Parrish the EDPs recommendation that the pt discontinue zofran and continue avelox.  Leslie Parrish agreed and will contact pt   1015 [facility] ED CM received a [facility] [place] [facility] from [phone] at 0930 about interaction between prescribed zofran ODT ordered 04/27/15 and Avelox ordered 04/30/15 for pt  Cm consulted with EDPs [place] and [place] Conclusion recommendation pt stop zofran continue avelox  Returned a call to [place] [facility] [phone] after busy signal at [phone]

## 2015-05-01 NOTE — Telephone Encounter (Signed)
Call placed to the patient to inquire about her need for a follow up medical appointment after her ED visit.  She said that her daughter, Malachi BondsGloria, was out and she requested that CM call back later.

## 2015-05-05 ENCOUNTER — Inpatient Hospital Stay: Payer: Medicare Other | Admitting: Family Medicine

## 2015-05-08 ENCOUNTER — Encounter (HOSPITAL_COMMUNITY): Payer: Self-pay

## 2015-05-08 ENCOUNTER — Emergency Department (HOSPITAL_COMMUNITY)
Admission: EM | Admit: 2015-05-08 | Discharge: 2015-05-08 | Disposition: A | Discharge status: Home / Self Care | Payer: Medicare Other | Attending: Emergency Medicine | Admitting: Emergency Medicine

## 2015-05-08 ENCOUNTER — Emergency Department (HOSPITAL_COMMUNITY): Payer: Medicare Other

## 2015-05-08 DIAGNOSIS — R05 Cough: Secondary | ICD-10-CM | POA: Insufficient documentation

## 2015-05-08 DIAGNOSIS — J441 Chronic obstructive pulmonary disease with (acute) exacerbation: Secondary | ICD-10-CM | POA: Diagnosis not present

## 2015-05-08 DIAGNOSIS — Z87440 Personal history of urinary (tract) infections: Secondary | ICD-10-CM | POA: Insufficient documentation

## 2015-05-08 DIAGNOSIS — Z7982 Long term (current) use of aspirin: Secondary | ICD-10-CM | POA: Insufficient documentation

## 2015-05-08 DIAGNOSIS — N3000 Acute cystitis without hematuria: Secondary | ICD-10-CM | POA: Insufficient documentation

## 2015-05-08 DIAGNOSIS — Z880 Allergy status to penicillin: Secondary | ICD-10-CM | POA: Diagnosis not present

## 2015-05-08 DIAGNOSIS — I509 Heart failure, unspecified: Secondary | ICD-10-CM | POA: Diagnosis not present

## 2015-05-08 DIAGNOSIS — E119 Type 2 diabetes mellitus without complications: Secondary | ICD-10-CM | POA: Insufficient documentation

## 2015-05-08 DIAGNOSIS — Z8701 Personal history of pneumonia (recurrent): Secondary | ICD-10-CM | POA: Insufficient documentation

## 2015-05-08 DIAGNOSIS — Z79899 Other long term (current) drug therapy: Secondary | ICD-10-CM | POA: Insufficient documentation

## 2015-05-08 DIAGNOSIS — K219 Gastro-esophageal reflux disease without esophagitis: Secondary | ICD-10-CM | POA: Diagnosis not present

## 2015-05-08 DIAGNOSIS — I10 Essential (primary) hypertension: Secondary | ICD-10-CM | POA: Diagnosis not present

## 2015-05-08 DIAGNOSIS — R059 Cough, unspecified: Secondary | ICD-10-CM

## 2015-05-08 DIAGNOSIS — R300 Dysuria: Secondary | ICD-10-CM | POA: Diagnosis present

## 2015-05-08 LAB — URINALYSIS, ROUTINE W REFLEX MICROSCOPIC
Bilirubin Urine: NEGATIVE
Glucose, UA: NEGATIVE mg/dL
KETONES UR: NEGATIVE mg/dL
Nitrite: POSITIVE — AB
PH: 6 (ref 5.0–8.0)
PROTEIN: 100 mg/dL — AB
SPECIFIC GRAVITY, URINE: 1.01 (ref 1.005–1.030)
Urobilinogen, UA: 0.2 mg/dL (ref 0.0–1.0)

## 2015-05-08 LAB — URINE MICROSCOPIC-ADD ON

## 2015-05-08 MED ORDER — CEPHALEXIN 500 MG PO CAPS: 500.0000 mg | ORAL_CAPSULE | Freq: Three times a day (TID) | ORAL | Status: DC

## 2015-05-08 MED ORDER — CEPHALEXIN 500 MG PO CAPS
500.0000 mg | ORAL_CAPSULE | Freq: Once | ORAL | Status: DC
  Filled 2015-05-08: qty 1

## 2015-05-08 NOTE — ED Notes (Addendum)
Pt upset with delay. Continue to reassure pt that we are trying to get each pt seen promptly.  Explained delay. Will continue to update pt and family.  Prompted again for urine sample and pt states unable to void at this time.

## 2015-05-08 NOTE — ED Notes (Signed)
It has been explained to pt and family the triage/wait process multiple times throught pts wait.

## 2015-05-08 NOTE — ED Notes (Signed)
Pt was seen here x 1 week ago.   Pt given antibiotics for pneumonia and UTI.  Pt was given antibiotics and wants it rechecked.  Pt daughter states she also complained of continued shortness of breath this morning and wants a repeat CXR.  No fever at home.

## 2015-05-08 NOTE — ED Provider Notes (Signed)
CSN: [id number]     Arrival date & time 05/08/15  1540 History   First MD Initiated Contact with Patient 05/08/15 1932     Chief Complaint  Patient presents with  . Shortness of Breath  . Dysuria    HPI Patient reports she was recently treated with antibiotics for pneumonia.  She reports her cough and shortness of breath are significantly improved however she is requesting a chest x-ray because she is worried about her pneumonia and "wants to make sure it is better".  She denies shortness breath at this time.  No fevers or chills.  She does report dysuria over the past 2 days.  She reports she was given a prescription for Macrobid.  Patient has a history of recurrent urinary tract infections.  She recently relocated to this region from [place].  Denies abdominal pain at this time.  No back pain or flank pain.  Denies nausea vomiting.  No altered mental status.   Past Medical History  Diagnosis Date  . Hypertension   . CHF (congestive heart failure)   . GERD (gastroesophageal reflux disease)   . COPD (chronic obstructive pulmonary disease)   . Asthma   . Diabetes mellitus without complication   . UTI (lower urinary tract infection)    Past Surgical History  Procedure Laterality Date  . Abdominal hysterectomy    . Appendectomy     Family History  Problem Relation Age of Onset  . Family history unknown: Yes   History  Substance Use Topics  . Smoking status: Never Smoker   . Smokeless tobacco: Not on file  . Alcohol Use: No   OB History    Gravida Para Term Preterm AB TAB SAB Ectopic Multiple Living   6 6             Review of Systems  All other systems reviewed and are negative.     Allergies  Albuterol sulfate; Penicillins; Sulfa antibiotics; and Compazine  Home Medications   Prior to Admission medications   Medication Sig Start Date End Date Taking? Authorizing Provider  amLODipine (NORVASC) 10 MG tablet Take 10 mg by mouth daily.   Yes Historical Provider, MD   anastrozole (ARIMIDEX) 1 MG tablet Take 1 mg by mouth daily.   Yes Historical Provider, MD  aspirin EC 81 MG tablet Take 81 mg by mouth daily.   Yes Historical Provider, MD  candesartan (ATACAND) 32 MG tablet Take 32 mg by mouth daily.   Yes Historical Provider, MD  Cholecalciferol (VITAMIN D-3 PO) Take 1 tablet by mouth daily.   Yes Historical Provider, MD  cloNIDine (CATAPRES) 0.2 MG tablet Take 0.2 mg by mouth 2 (two) times daily.   Yes Historical Provider, MD  dicyclomine (BENTYL) 20 MG tablet Take 20 mg by mouth daily as needed for spasms.   Yes Historical Provider, MD  hydrALAZINE (APRESOLINE) 100 MG tablet Take 100 mg by mouth 3 (three) times daily.   Yes Historical Provider, MD  ketoconazole (NIZORAL) 2 % cream Apply 1 application topically at bedtime. 12/21/14  Yes Historical Provider, MD  levalbuterol Pauline Aus) 1.25 MG/3ML nebulizer solution Inhale 1 ampule into the lungs 4 (four) times daily. 12/23/14  Yes Historical Provider, MD  ondansetron (ZOFRAN ODT) 4 MG disintegrating tablet Take 1 tablet (4 mg total) by mouth every 8 (eight) hours as needed for nausea or vomiting. 04/27/15  Yes Jerelyn Scott, MD  oxyCODONE (OXY IR/ROXICODONE) 5 MG immediate release tablet Take 0.5 tablets by mouth 2 (two)  times daily as needed. pain 04/13/15  Yes Historical Provider, MD  oxyCODONE-acetaminophen (PERCOCET/ROXICET) 5-325 MG per tablet Take 1-2 tablets by mouth every 6 (six) hours as needed for severe pain. 04/27/15  Yes Jerelyn ScottMartha Linker, MD  pantoprazole (PROTONIX) 40 MG tablet Take 40 mg by mouth daily.   Yes Historical Provider, MD  potassium chloride SA (K-DUR,KLOR-CON) 20 MEQ tablet Take 20 mEq by mouth daily.   Yes Historical Provider, MD  torsemide (DEMADEX) 20 MG tablet Take 20 mg by mouth daily.   Yes Historical Provider, MD  cephALEXin (KEFLEX) 500 MG capsule Take 1 capsule (500 mg total) by mouth 3 (three) times daily. 05/08/15   Azalia BilisKevin Pierra Skora, MD   BP 179/57 mmHg  Pulse 59  Temp(Src) 97.8 F  (36.6 C) (Oral)  Resp 23  SpO2 95% Physical Exam  Constitutional: She is oriented to person, place, and time. She appears well-developed and well-nourished. No distress.  HENT:  Head: Normocephalic and atraumatic.  Eyes: EOM are normal.  Neck: Normal range of motion.  Cardiovascular: Normal rate, regular rhythm and normal heart sounds.   Pulmonary/Chest: Effort normal and breath sounds normal.  Abdominal: Soft. She exhibits no distension. There is no tenderness.  Musculoskeletal: Normal range of motion.  Neurological: She is alert and oriented to person, place, and time.  Skin: Skin is warm and dry.  Psychiatric: She has a normal mood and affect. Judgment normal.  Nursing note and vitals reviewed.   ED Course  Procedures (including critical care time) Labs Review Labs Reviewed  URINALYSIS, ROUTINE W REFLEX MICROSCOPIC (NOT AT [facility]) - Abnormal; Notable for the following:    Color, Urine ORANGE (*)    APPearance CLOUDY (*)    Hgb urine dipstick SMALL (*)    Protein, ur 100 (*)    Nitrite POSITIVE (*)    Leukocytes, UA LARGE (*)    All other components within normal limits  URINE MICROSCOPIC-ADD ON - Abnormal; Notable for the following:    Bacteria, UA MANY (*)    All other components within normal limits  URINE CULTURE    Imaging Review Dg Chest 2 View  05/08/2015   CLINICAL DATA:  Shortness of breath, recent diagnosis of pneumonia  EXAM: CHEST - 2 VIEW  COMPARISON:  04/30/2015  FINDINGS: Cardiac shadow is again enlarged. Postsurgical changes are again seen. Bibasilar changes are again noted but improved slightly in the interval from the prior exam persistent right effusion is seen. No acute bony abnormality is noted.  IMPRESSION: Slight improved aeration in the bases bilaterally   Electronically Signed   By: Alcide CleverMark  Lukens M.D.   On: 05/08/2015 16:21  I personally reviewed the imaging tests through PACS system I reviewed available ER/hospitalization records through the  EMR    EKG Interpretation None      MDM   Final diagnoses:  Acute cystitis without hematuria  Cough    Clinically the patient's pneumonia is improving.  No hypoxia.  She reports her breathing is improving.  She does have COPD.  She is oxygen dependent.  Chest x-ray is starting to look better however this is just lacking the clinical picture.  Urine culture sent.  She will be sent home with Keflex for UTI    Azalia BilisKevin Adrea Sherpa, MD 05/09/15 [phone]

## 2015-05-08 NOTE — ED Notes (Signed)
Patient given sandwich, drink and peanut butter and graham crackers.  Family members also given drinks.

## 2015-05-08 NOTE — ED Notes (Signed)
MD at bedside. 

## 2015-05-08 NOTE — ED Notes (Addendum)
Discharged patient home with daughter.  Patient given instructions to take keflex 3 times a day.  MD at bedside explaining importance of following medication instructions as well as performing breathing treatments as prescribed at home.  Patient voiced understanding.  MD advised patient it was in her best interest to take Keflex tonight but patient refused.  States "it will make me sick to my stomach".  Patient given saltines to take with Keflex.  Patient refused.

## 2015-05-11 LAB — URINE CULTURE: Culture: >=100000

## 2015-05-12 ENCOUNTER — Telehealth (HOSPITAL_BASED_OUTPATIENT_CLINIC_OR_DEPARTMENT_OTHER): Payer: Self-pay | Admitting: Emergency Medicine

## 2015-05-12 NOTE — Telephone Encounter (Signed)
Post ED Visit - Positive Culture Follow-up: Successful Patient Follow-Up  Culture assessed and recommendations reviewed by: [x]  Leslie Parrish, Pharm.D., BCPS-AQ ID []  Leslie Parrish, [address] BCPS []  [place] Par[place], [address]Ph[address]D., BCPS, AAHIVP []  Leslie Parrish, Pharm.D., BCPS, AAHIVP []  Leslie Parrish, Pharm.D. []  Leslie Parrish, [place].  Positive urine culture ESBL E. Coli  []  Patient discharged without antimicrobial prescription and treatment is now indicated [x]  Organism is resistant to prescribed ED discharge antimicrobial []  Patient with positive blood cultures  Changes discussed with ED provider: Jaynie Crumbleatyana Kirichenko PA New antibiotic prescription, stop keflex, start Fosfomycin 3gram po x 1 dose Called to [employer] [place] @ [phone]  Contacted patient, 05/12/15 1100   Leslie Parrish, Leslie Parrish 05/12/2015, 10:58 AM

## 2015-05-14 ENCOUNTER — Emergency Department (HOSPITAL_COMMUNITY): Payer: Medicare Other

## 2015-05-14 ENCOUNTER — Encounter (HOSPITAL_COMMUNITY): Payer: Self-pay | Admitting: Emergency Medicine

## 2015-05-14 ENCOUNTER — Inpatient Hospital Stay (HOSPITAL_COMMUNITY)
Admission: EM | Admit: 2015-05-14 | Discharge: 2015-05-22 | DRG: 292 | Disposition: A | Discharge status: Home Health | Payer: Medicare Other | Attending: Internal Medicine | Admitting: Internal Medicine

## 2015-05-14 DIAGNOSIS — Z951 Presence of aortocoronary bypass graft: Secondary | ICD-10-CM | POA: Diagnosis not present

## 2015-05-14 DIAGNOSIS — N178 Other acute kidney failure: Secondary | ICD-10-CM

## 2015-05-14 DIAGNOSIS — I509 Heart failure, unspecified: Secondary | ICD-10-CM | POA: Diagnosis not present

## 2015-05-14 DIAGNOSIS — N390 Urinary tract infection, site not specified: Secondary | ICD-10-CM | POA: Diagnosis present

## 2015-05-14 DIAGNOSIS — R32 Unspecified urinary incontinence: Secondary | ICD-10-CM | POA: Diagnosis present

## 2015-05-14 DIAGNOSIS — Z79899 Other long term (current) drug therapy: Secondary | ICD-10-CM

## 2015-05-14 DIAGNOSIS — R0602 Shortness of breath: Secondary | ICD-10-CM | POA: Diagnosis not present

## 2015-05-14 DIAGNOSIS — R109 Unspecified abdominal pain: Secondary | ICD-10-CM

## 2015-05-14 DIAGNOSIS — I5021 Acute systolic (congestive) heart failure: Secondary | ICD-10-CM | POA: Diagnosis not present

## 2015-05-14 DIAGNOSIS — K5900 Constipation, unspecified: Secondary | ICD-10-CM | POA: Diagnosis not present

## 2015-05-14 DIAGNOSIS — R61 Generalized hyperhidrosis: Secondary | ICD-10-CM | POA: Diagnosis present

## 2015-05-14 DIAGNOSIS — Z9049 Acquired absence of other specified parts of digestive tract: Secondary | ICD-10-CM | POA: Diagnosis present

## 2015-05-14 DIAGNOSIS — Z79891 Long term (current) use of opiate analgesic: Secondary | ICD-10-CM | POA: Diagnosis not present

## 2015-05-14 DIAGNOSIS — Z9981 Dependence on supplemental oxygen: Secondary | ICD-10-CM | POA: Diagnosis not present

## 2015-05-14 DIAGNOSIS — Z9119 Patient's noncompliance with other medical treatment and regimen: Secondary | ICD-10-CM | POA: Diagnosis present

## 2015-05-14 DIAGNOSIS — I5041 Acute combined systolic (congestive) and diastolic (congestive) heart failure: Secondary | ICD-10-CM | POA: Diagnosis present

## 2015-05-14 DIAGNOSIS — J45909 Unspecified asthma, uncomplicated: Secondary | ICD-10-CM | POA: Diagnosis present

## 2015-05-14 DIAGNOSIS — B9620 Unspecified Escherichia coli [E. coli] as the cause of diseases classified elsewhere: Secondary | ICD-10-CM | POA: Diagnosis present

## 2015-05-14 DIAGNOSIS — Z882 Allergy status to sulfonamides status: Secondary | ICD-10-CM | POA: Diagnosis not present

## 2015-05-14 DIAGNOSIS — I10 Essential (primary) hypertension: Secondary | ICD-10-CM | POA: Diagnosis not present

## 2015-05-14 DIAGNOSIS — Z87440 Personal history of urinary (tract) infections: Secondary | ICD-10-CM | POA: Diagnosis not present

## 2015-05-14 DIAGNOSIS — N184 Chronic kidney disease, stage 4 (severe): Secondary | ICD-10-CM | POA: Diagnosis present

## 2015-05-14 DIAGNOSIS — I429 Cardiomyopathy, unspecified: Secondary | ICD-10-CM | POA: Diagnosis present

## 2015-05-14 DIAGNOSIS — E119 Type 2 diabetes mellitus without complications: Secondary | ICD-10-CM | POA: Diagnosis present

## 2015-05-14 DIAGNOSIS — I2510 Atherosclerotic heart disease of native coronary artery without angina pectoris: Secondary | ICD-10-CM | POA: Diagnosis present

## 2015-05-14 DIAGNOSIS — K219 Gastro-esophageal reflux disease without esophagitis: Secondary | ICD-10-CM | POA: Diagnosis present

## 2015-05-14 DIAGNOSIS — D649 Anemia, unspecified: Secondary | ICD-10-CM | POA: Diagnosis present

## 2015-05-14 DIAGNOSIS — J441 Chronic obstructive pulmonary disease with (acute) exacerbation: Secondary | ICD-10-CM | POA: Diagnosis present

## 2015-05-14 DIAGNOSIS — J9610 Chronic respiratory failure, unspecified whether with hypoxia or hypercapnia: Secondary | ICD-10-CM | POA: Diagnosis present

## 2015-05-14 DIAGNOSIS — I495 Sick sinus syndrome: Secondary | ICD-10-CM | POA: Diagnosis not present

## 2015-05-14 DIAGNOSIS — N179 Acute kidney failure, unspecified: Secondary | ICD-10-CM

## 2015-05-14 DIAGNOSIS — I471 Supraventricular tachycardia: Secondary | ICD-10-CM | POA: Diagnosis not present

## 2015-05-14 DIAGNOSIS — I5023 Acute on chronic systolic (congestive) heart failure: Secondary | ICD-10-CM | POA: Diagnosis not present

## 2015-05-14 DIAGNOSIS — I129 Hypertensive chronic kidney disease with stage 1 through stage 4 chronic kidney disease, or unspecified chronic kidney disease: Secondary | ICD-10-CM | POA: Diagnosis present

## 2015-05-14 DIAGNOSIS — I472 Ventricular tachycardia: Secondary | ICD-10-CM | POA: Diagnosis present

## 2015-05-14 DIAGNOSIS — N189 Chronic kidney disease, unspecified: Secondary | ICD-10-CM

## 2015-05-14 DIAGNOSIS — Z888 Allergy status to other drugs, medicaments and biological substances status: Secondary | ICD-10-CM

## 2015-05-14 DIAGNOSIS — I5031 Acute diastolic (congestive) heart failure: Secondary | ICD-10-CM

## 2015-05-14 DIAGNOSIS — Z7982 Long term (current) use of aspirin: Secondary | ICD-10-CM | POA: Diagnosis not present

## 2015-05-14 DIAGNOSIS — Z880 Allergy status to penicillin: Secondary | ICD-10-CM

## 2015-05-14 HISTORY — DX: Atherosclerotic heart disease of native coronary artery without angina pectoris: I25.10

## 2015-05-14 LAB — TROPONIN I
TROPONIN I: 0.03 ng/mL (ref <0.031)
Troponin I: 0.03 ng/mL (ref <0.031)

## 2015-05-14 LAB — CBC
HCT: 29.1 % — ABNORMAL LOW (ref 36.0–46.0)
Hemoglobin: 9 g/dL — ABNORMAL LOW (ref 12.0–15.0)
MCH: 26.6 pg (ref 26.0–34.0)
MCHC: 30.9 g/dL (ref 30.0–36.0)
MCV: 86.1 fL (ref 78.0–100.0)
PLATELETS: 179 10*3/uL (ref 150–400)
RBC: 3.38 MIL/uL — ABNORMAL LOW (ref 3.87–5.11)
RDW: 15.4 % (ref 11.5–15.5)
WBC: 6.3 10*3/uL (ref 4.0–10.5)

## 2015-05-14 LAB — BASIC METABOLIC PANEL
Anion gap: 9 (ref 5–15)
BUN: 44 mg/dL — AB (ref 6–20)
CALCIUM: 8.8 mg/dL — AB (ref 8.9–10.3)
CO2: 28 mmol/L (ref 22–32)
Chloride: 105 mmol/L (ref 101–111)
Creatinine, Ser: 1.9 mg/dL — ABNORMAL HIGH (ref 0.44–1.00)
GFR calc Af Amer: 27 mL/min — ABNORMAL LOW (ref >60)
GFR calc non Af Amer: 23 mL/min — ABNORMAL LOW (ref >60)
Glucose, Bld: 164 mg/dL — ABNORMAL HIGH (ref 65–99)
Potassium: 4.1 mmol/L (ref 3.5–5.1)
Sodium: 142 mmol/L (ref 135–145)

## 2015-05-14 LAB — URINALYSIS, ROUTINE W REFLEX MICROSCOPIC
Bilirubin Urine: NEGATIVE
Glucose, UA: NEGATIVE mg/dL
Hgb urine dipstick: NEGATIVE
Ketones, ur: NEGATIVE mg/dL
NITRITE: NEGATIVE
PH: 5.5 (ref 5.0–8.0)
PROTEIN: 30 mg/dL — AB
Specific Gravity, Urine: 1.012 (ref 1.005–1.030)
Urobilinogen, UA: 0.2 mg/dL (ref 0.0–1.0)

## 2015-05-14 LAB — GLUCOSE, CAPILLARY: GLUCOSE-CAPILLARY: 177 mg/dL — AB (ref 65–99)

## 2015-05-14 LAB — BRAIN NATRIURETIC PEPTIDE: B NATRIURETIC PEPTIDE 5: 1487.4 pg/mL — AB (ref 0.0–100.0)

## 2015-05-14 LAB — TSH: TSH: 2.745 u[IU]/mL (ref 0.350–4.500)

## 2015-05-14 LAB — URINE MICROSCOPIC-ADD ON

## 2015-05-14 MED ORDER — FUROSEMIDE 10 MG/ML IJ SOLN
40.0000 mg | Freq: Every day | INTRAMUSCULAR | Status: DC
  Administered 2015-05-14 – 2015-05-15 (×2): 40 mg via INTRAVENOUS
  Filled 2015-05-14 (×2): qty 4

## 2015-05-14 MED ORDER — INSULIN ASPART 100 UNIT/ML ~~LOC~~ SOLN: 0.0000 [IU] | Freq: Every day | SUBCUTANEOUS | Status: DC

## 2015-05-14 MED ORDER — SODIUM CHLORIDE 0.9 % IJ SOLN
3.0000 mL | Freq: Two times a day (BID) | INTRAMUSCULAR | Status: DC
  Administered 2015-05-14 – 2015-05-22 (×9): 3 mL via INTRAVENOUS

## 2015-05-14 MED ORDER — HYDRALAZINE HCL 20 MG/ML IJ SOLN: 5.0000 mg | INTRAMUSCULAR | Status: DC | PRN

## 2015-05-14 MED ORDER — AMLODIPINE BESYLATE 10 MG PO TABS
10.0000 mg | ORAL_TABLET | Freq: Every day | ORAL | Status: DC
  Administered 2015-05-14 – 2015-05-22 (×9): 10 mg via ORAL
  Filled 2015-05-14 (×9): qty 1

## 2015-05-14 MED ORDER — ONDANSETRON HCL 4 MG/2ML IJ SOLN
4.0000 mg | Freq: Four times a day (QID) | INTRAMUSCULAR | Status: DC | PRN
  Administered 2015-05-15: 4 mg via INTRAVENOUS
  Filled 2015-05-14: qty 2

## 2015-05-14 MED ORDER — LEVALBUTEROL HCL 1.25 MG/0.5ML IN NEBU
1.2500 mg | INHALATION_SOLUTION | Freq: Once | RESPIRATORY_TRACT | Status: AC
  Administered 2015-05-14: 1.25 mg via RESPIRATORY_TRACT
  Filled 2015-05-14: qty 0.5

## 2015-05-14 MED ORDER — ASPIRIN EC 81 MG PO TBEC
81.0000 mg | DELAYED_RELEASE_TABLET | Freq: Every day | ORAL | Status: DC
  Administered 2015-05-14 – 2015-05-22 (×8): 81 mg via ORAL
  Filled 2015-05-14 (×9): qty 1

## 2015-05-14 MED ORDER — OXYCODONE HCL 5 MG PO TABS
2.5000 mg | ORAL_TABLET | Freq: Two times a day (BID) | ORAL | Status: DC | PRN
  Administered 2015-05-17: 2.5 mg via ORAL
  Filled 2015-05-14 (×3): qty 1

## 2015-05-14 MED ORDER — DICYCLOMINE HCL 20 MG PO TABS
20.0000 mg | ORAL_TABLET | Freq: Every day | ORAL | Status: DC | PRN
  Administered 2015-05-18 – 2015-05-22 (×6): 20 mg via ORAL
  Filled 2015-05-14 (×11): qty 1

## 2015-05-14 MED ORDER — ANASTROZOLE 1 MG PO TABS
1.0000 mg | ORAL_TABLET | Freq: Every day | ORAL | Status: DC
  Administered 2015-05-14 – 2015-05-22 (×9): 1 mg via ORAL
  Filled 2015-05-14 (×9): qty 1

## 2015-05-14 MED ORDER — INSULIN ASPART 100 UNIT/ML ~~LOC~~ SOLN
0.0000 [IU] | Freq: Three times a day (TID) | SUBCUTANEOUS | Status: DC
  Administered 2015-05-15: 2 [IU] via SUBCUTANEOUS
  Administered 2015-05-15: 3 [IU] via SUBCUTANEOUS
  Administered 2015-05-20: 2 [IU] via SUBCUTANEOUS

## 2015-05-14 MED ORDER — MORPHINE SULFATE 2 MG/ML IJ SOLN
2.0000 mg | INTRAMUSCULAR | Status: DC | PRN
  Administered 2015-05-21: 2 mg via INTRAVENOUS
  Filled 2015-05-14: qty 1

## 2015-05-14 MED ORDER — HYDRALAZINE HCL 50 MG PO TABS
100.0000 mg | ORAL_TABLET | Freq: Three times a day (TID) | ORAL | Status: DC
  Administered 2015-05-14 – 2015-05-22 (×15): 100 mg via ORAL
  Filled 2015-05-14 (×21): qty 2

## 2015-05-14 MED ORDER — IPRATROPIUM-ALBUTEROL 0.5-2.5 (3) MG/3ML IN SOLN: 3.0000 mL | RESPIRATORY_TRACT | Status: DC | PRN

## 2015-05-14 MED ORDER — PANTOPRAZOLE SODIUM 40 MG PO TBEC
40.0000 mg | DELAYED_RELEASE_TABLET | Freq: Every day | ORAL | Status: DC
  Administered 2015-05-15 – 2015-05-22 (×8): 40 mg via ORAL
  Filled 2015-05-14 (×9): qty 1

## 2015-05-14 MED ORDER — HEPARIN SODIUM (PORCINE) 5000 UNIT/ML IJ SOLN
5000.0000 [IU] | Freq: Three times a day (TID) | INTRAMUSCULAR | Status: DC
  Administered 2015-05-14 – 2015-05-21 (×16): 5000 [IU] via SUBCUTANEOUS
  Filled 2015-05-14 (×19): qty 1

## 2015-05-14 MED ORDER — ONDANSETRON HCL 4 MG PO TABS
4.0000 mg | ORAL_TABLET | Freq: Four times a day (QID) | ORAL | Status: DC | PRN
  Administered 2015-05-19 – 2015-05-21 (×2): 4 mg via ORAL
  Filled 2015-05-14 (×2): qty 1

## 2015-05-14 MED ORDER — CLONIDINE HCL 0.1 MG PO TABS
0.2000 mg | ORAL_TABLET | Freq: Two times a day (BID) | ORAL | Status: DC
  Administered 2015-05-14 – 2015-05-20 (×11): 0.2 mg via ORAL
  Filled 2015-05-14 (×12): qty 2

## 2015-05-14 MED ORDER — ACETAMINOPHEN 650 MG RE SUPP: 650.0000 mg | Freq: Four times a day (QID) | RECTAL | Status: DC | PRN

## 2015-05-14 MED ORDER — ACETAMINOPHEN 325 MG PO TABS
650.0000 mg | ORAL_TABLET | Freq: Four times a day (QID) | ORAL | Status: DC | PRN
  Administered 2015-05-19 – 2015-05-20 (×3): 650 mg via ORAL
  Filled 2015-05-14 (×3): qty 2

## 2015-05-14 MED ORDER — IPRATROPIUM BROMIDE 0.02 % IN SOLN
0.5000 mg | Freq: Once | RESPIRATORY_TRACT | Status: AC
  Administered 2015-05-14: 0.5 mg via RESPIRATORY_TRACT
  Filled 2015-05-14: qty 2.5

## 2015-05-14 MED ORDER — ALBUTEROL SULFATE (2.5 MG/3ML) 0.083% IN NEBU: 5.0000 mg | INHALATION_SOLUTION | Freq: Once | RESPIRATORY_TRACT | Status: DC

## 2015-05-14 NOTE — ED Notes (Signed)
Attempted IV access x 2 attempts, both were unsuccessful.  Made Primary RN aware.  Will put in consult for IV team.

## 2015-05-14 NOTE — Progress Notes (Signed)
This pt has been [facility] and [facility] Erskine SquibbJane [place] Pt has [facility] Admission x 1 and ED visits x 7   Pt has not obtained a family pcp at this time even after offered numerous pcp resources and other [place] resources  Pt and daughters non compliant with f/u services offered

## 2015-05-14 NOTE — ED Notes (Signed)
I attempted to collect labs on patient .  Patient request I stick her in her hand.  I stuck her in her hand as soon as I got the needle in she requested for me to take it out stated she cant take it anymore.  I made nurse aware.

## 2015-05-14 NOTE — ED Notes (Signed)
Patient refuses to get in the bed and is currently sitting in a wheelchair by the bed.

## 2015-05-14 NOTE — ED Notes (Signed)
Pt drinking water, pt instructed not to drink water. Pt asked to refrain from drinking water at this time for her personal safety. Pt refuses to refrain from drinking water, states "I have diabetes, just give me water."

## 2015-05-14 NOTE — ED Notes (Signed)
Pt with Hx of COPD c/o SOB and fatigue onset this morning, pt states that she recently had pneumonia and was given pills to treat. Pt wears 2.5 L/min O2 at home, pt came in on home O2 set to 2.5 L/min [place], pt O2 saturation was 88 %. Pt  moved to wall mount and set to 2.5 L/min, pt O2 now at 99%. Pt taking Macrobid for UTI.

## 2015-05-14 NOTE — H&P (Addendum)
[facility] History and Physical  Leslie Parrish WUJ:[id number] DOB: [birth year] DOA: 05/14/2015  Referring physician: Emergency Department PCP: No primary care provider on file.  Specialists:   Chief Complaint: SOB  HPI: Leslie Parrish is a 81 y.o. female  With a hx of HTN, CHF with unknown EF, COPD, CAD s/p CABG in 2012 done in [place], DM2 on chronic home O2 at 2.5L who presents to the ED with complaints of sob. Pt has been seen in ED for similar complaints 8 times in the past 6 mos. In the ED, pt was noted to have Cr of 1.9 with baseline of 1.6-1.7, elevated BNP of 1487. CXR w/ caridiomegaly with persistent small effusions. ED staff noted wheezing on exam. Pt was given breathing tx with some improvement. Hospitalist consulted for admission.  Review of Systems:  Review of Systems  Constitutional: Negative for chills and diaphoresis.  HENT: Negative for ear pain and tinnitus.   Eyes: Negative for double vision and pain.  Respiratory: Positive for shortness of breath. Negative for hemoptysis, sputum production and wheezing.   Cardiovascular: Negative for palpitations and orthopnea.  Gastrointestinal: Negative for nausea and vomiting.  Genitourinary: Negative for urgency and frequency.  Musculoskeletal: Negative for joint pain, falls and neck pain.  Neurological: Negative for tremors, seizures and loss of consciousness.  Psychiatric/Behavioral: Negative for hallucinations. The patient is not nervous/anxious.      Past Medical History  Diagnosis Date  . Hypertension   . CHF (congestive heart failure)   . GERD (gastroesophageal reflux disease)   . COPD (chronic obstructive pulmonary disease)   . Asthma   . Diabetes mellitus without complication   . UTI (lower urinary tract infection)    Past Surgical History  Procedure Laterality Date  . Abdominal hysterectomy    . Appendectomy     Social History:  reports that she has never smoked. She does not have any smokeless tobacco history  on file. She reports that she does not drink alcohol or use illicit drugs.  where does patient live--home, ALF, SNF? and with whom if at home?  Can patient participate in ADLs?  Allergies  Allergen Reactions  . Albuterol Sulfate     Per Md- does not want her taking because it raises her blood pressure.   Marland Kitchen Penicillins Swelling  . Sulfa Antibiotics Swelling  . Compazine [Prochlorperazine Edisylate] Palpitations    Family History  Problem Relation Age of Onset  . Family history unknown: Yes    (be sure to complete)  Prior to Admission medications   Medication Sig Start Date End Date Taking? Authorizing Provider  amLODipine (NORVASC) 10 MG tablet Take 10 mg by mouth daily.   Yes Historical Provider, MD  anastrozole (ARIMIDEX) 1 MG tablet Take 1 mg by mouth daily.   Yes Historical Provider, MD  aspirin EC 81 MG tablet Take 81 mg by mouth daily.   Yes Historical Provider, MD  candesartan (ATACAND) 32 MG tablet Take 32 mg by mouth daily.   Yes Historical Provider, MD  Cholecalciferol (VITAMIN D-3 PO) Take 1 tablet by mouth daily.   Yes Historical Provider, MD  cloNIDine (CATAPRES) 0.2 MG tablet Take 0.2 mg by mouth 2 (two) times daily.   Yes Historical Provider, MD  dicyclomine (BENTYL) 20 MG tablet Take 20 mg by mouth daily as needed for spasms.   Yes Historical Provider, MD  hydrALAZINE (APRESOLINE) 100 MG tablet Take 100 mg by mouth 3 (three) times daily.   Yes Historical Provider, MD  ketoconazole (  NIZORAL) 2 % cream Apply 1 application topically at bedtime. 12/21/14  Yes Historical Provider, MD  levalbuterol Pauline Aus(XOPENEX) 1.25 MG/3ML nebulizer solution Inhale 1 ampule into the lungs 4 (four) times daily. 12/23/14  Yes Historical Provider, MD  nitrofurantoin (MACRODANTIN) 100 MG capsule Take 100 mg by mouth 2 (two) times daily. For 3 days 05/12/15 05/15/15 Yes Historical Provider, MD  ondansetron (ZOFRAN ODT) 4 MG disintegrating tablet Take 1 tablet (4 mg total) by mouth every 8 (eight) hours as  needed for nausea or vomiting. 04/27/15  Yes Jerelyn ScottMartha Linker, MD  oxyCODONE (OXY IR/ROXICODONE) 5 MG immediate release tablet Take 0.5 tablets by mouth 2 (two) times daily as needed. pain 04/13/15  Yes Historical Provider, MD  oxyCODONE-acetaminophen (PERCOCET/ROXICET) 5-325 MG per tablet Take 1-2 tablets by mouth every 6 (six) hours as needed for severe pain. 04/27/15  Yes Jerelyn ScottMartha Linker, MD  pantoprazole (PROTONIX) 40 MG tablet Take 40 mg by mouth daily.   Yes Historical Provider, MD  potassium chloride SA (K-DUR,KLOR-CON) 20 MEQ tablet Take 20 mEq by mouth daily.   Yes Historical Provider, MD  torsemide (DEMADEX) 20 MG tablet Take 20 mg by mouth daily.   Yes Historical Provider, MD  cephALEXin (KEFLEX) 500 MG capsule Take 1 capsule (500 mg total) by mouth 3 (three) times daily. Patient not taking: Reported on 05/14/2015 05/08/15   Azalia BilisKevin Campos, MD   Physical Exam: Filed Vitals:   05/14/15 1500 05/14/15 1530 05/14/15 1600 05/14/15 1722  BP: 162/59 172/99 190/80 186/62  Pulse: 45 54 61 56  Resp: 16 18 23 22   SpO2: 100% 100% 91% 92%     General:  Awake, in nad  Eyes: PERRL B  ENT: membranes moist, dentition fair  Neck: trachea midline, neck supple  Cardiovascular: regular, s1, s2  Respiratory: normal resp effort, no wheezing  Abdomen: soft,nondistended  Skin: normal skin turgor, no abnormal skin lesions seen  Musculoskeletal: perfused, no clubbing, RLE edema  Psychiatric: normal mood/affect, no auditory/visual hallucinations  Neurologic: cn2-12 grossly intact, strength/sensation intact  Labs on Admission:  Basic Metabolic Panel:  Recent Labs Lab 05/14/15 1454  NA 142  K 4.1  CL 105  CO2 28  GLUCOSE 164*  BUN 44*  CREATININE 1.90*  CALCIUM 8.8*   Liver Function Tests: No results for input(s): AST, ALT, ALKPHOS, BILITOT, PROT, ALBUMIN in the last 168 hours. No results for input(s): LIPASE, AMYLASE in the last 168 hours. No results for input(s): AMMONIA in the last 168  hours. CBC:  Recent Labs Lab 05/14/15 1454  WBC 6.3  HGB 9.0*  HCT 29.1*  MCV 86.1  PLT 179   Cardiac Enzymes:  Recent Labs Lab 05/14/15 1454  TROPONINI 0.03    BNP (last 3 results)  Recent Labs  05/14/15 1454  BNP 1487.4*    ProBNP (last 3 results) No results for input(s): PROBNP in the last 8760 hours.  CBG: No results for input(s): GLUCAP in the last 168 hours.  Radiological Exams on Admission: Dg Chest 2 View  05/14/2015   CLINICAL DATA:  shortness of breath.  Recent right pleural effusion.  EXAM: CHEST  2 VIEW  COMPARISON:  04/27/2015, 05/08/2015.  FINDINGS: prior median sternotomy. Small pleural effusions noted bilaterally, larger on the right. There is associated bibasilar atelectasis. Upper lungs remain clear. No pneumothorax. Atherosclerosis of the aorta. Degenerative changes of the left shoulder and spine. No compression fracture  IMPRESSION: Cardiomegaly with persistent small effusions, large on the right. Associated bibasilar atelectasis. Stable postop changes.  Electronically Signed   By: Judie Petit.  Shick M.D.   On: 05/14/2015 14:35    Assessment/Plan Active Problems:   CHF (congestive heart failure)   Diabetes mellitus type 2 without retinopathy   HTN (hypertension)   COPD with acute exacerbation   ARF (acute renal failure)  1. SOB suspect secondary to acute CHF with copd exacerbation 1. CXR with cardiomegaly with small effusions, otherwise clear 2. Pt on baseline 2.5L [place] prior to admit 3. Elevated BNP on admit in the setting of documented chf hx 4. Will cont nebs, diuretics per below 5. Admit to med-tele 2. Suspected acute CHF, unknown if systolic vs diastolic 1. Elevated BNP this admission in the setting of increased sob 2. Will cont on scheduled IV lasix 3. Will monitor i/o and daily weights, lytes and renal fx 4. Will check 2d echo 5. Will check TSH 3. Hx CAD s/p CABG in 2012 1. Seems stable at present 2. Pt denies chest pain, nausea,  diaphoresis 3. Initial trop is neg 4. Will follow serial troponin 5. Follow up 2d echo results per above 6. Cont ASA 4. Possible mild COPD exacerbation 1. Will cont on duonebs PRN 2. No wheezing currently but ED staff did note some wheezing prior to neb tx 3. Pt is declining steroids 5. DM2 1. Will cont on SSI coverage 2. Will check a1c 6. HTN 1. BP poorly controlled 2. Cont home meds for now with exception of ARB secondary to ARF 3. Add PRN hydralazine for sbp>180 7. ARF 1. Avoid nephrotoxic agents 2. Will follow renal function closely 8. GERD 1. Stable 2. Cont PPI 9. DVT prophylaxis 1. Heparin subQ while admitted  Code Status: Full (must indicate code status--if unknown or must be presumed, indicate so) Family Communication: Pt in room (indicate person spoken with, if applicable, with phone number if by telephone) Disposition Plan: Admit to med-tele (indicate anticipated LOS)  Meet [place] [facility] Pager [phone]  If 7PM-7AM, please contact night-coverage [email or web address] Password [facility] 05/14/2015, 5:33 PM

## 2015-05-14 NOTE — ED Provider Notes (Signed)
CSN: [id number]     Arrival date & time 05/14/15  1339 History   First MD Initiated Contact with Patient 05/14/15 1503     Chief Complaint  Patient presents with  . Shortness of Breath     (Consider location/radiation/quality/duration/timing/severity/associated sxs/prior Treatment) HPI Comments: Patient here complaining of increased dyspnea 1 day. Just completed a regimen of Avelox for pneumonia. Has a history of COPD and is on home oxygen at 2.5 L. Denies any new fevers. No cough or congestion. No chest pain or chest pressure. Was also recently achieved for UTI and just completed a course of antibiotics for that as well 2. Patient has not been compliant with her albuterol treatments at home. She is not taking any oral prednisone at this time.  Patient is a 81 y.o. female presenting with shortness of breath. The history is provided by the patient and a relative.  Shortness of Breath   Past Medical History  Diagnosis Date  . Hypertension   . CHF (congestive heart failure)   . GERD (gastroesophageal reflux disease)   . COPD (chronic obstructive pulmonary disease)   . Asthma   . Diabetes mellitus without complication   . UTI (lower urinary tract infection)    Past Surgical History  Procedure Laterality Date  . Abdominal hysterectomy    . Appendectomy     Family History  Problem Relation Age of Onset  . Family history unknown: Yes   History  Substance Use Topics  . Smoking status: Never Smoker   . Smokeless tobacco: Not on file  . Alcohol Use: No   OB History    Gravida Para Term Preterm AB TAB SAB Ectopic Multiple Living   6 6             Review of Systems  Respiratory: Positive for shortness of breath.   All other systems reviewed and are negative.     Allergies  Albuterol sulfate; Penicillins; Sulfa antibiotics; and Compazine  Home Medications   Prior to Admission medications   Medication Sig Start Date End Date Taking? Authorizing Provider  amLODipine  (NORVASC) 10 MG tablet Take 10 mg by mouth daily.    Historical Provider, MD  anastrozole (ARIMIDEX) 1 MG tablet Take 1 mg by mouth daily.    Historical Provider, MD  aspirin EC 81 MG tablet Take 81 mg by mouth daily.    Historical Provider, MD  candesartan (ATACAND) 32 MG tablet Take 32 mg by mouth daily.    Historical Provider, MD  cephALEXin (KEFLEX) 500 MG capsule Take 1 capsule (500 mg total) by mouth 3 (three) times daily. 05/08/15   Azalia Bilis, MD  Cholecalciferol (VITAMIN D-3 PO) Take 1 tablet by mouth daily.    Historical Provider, MD  cloNIDine (CATAPRES) 0.2 MG tablet Take 0.2 mg by mouth 2 (two) times daily.    Historical Provider, MD  dicyclomine (BENTYL) 20 MG tablet Take 20 mg by mouth daily as needed for spasms.    Historical Provider, MD  hydrALAZINE (APRESOLINE) 100 MG tablet Take 100 mg by mouth 3 (three) times daily.    Historical Provider, MD  ketoconazole (NIZORAL) 2 % cream Apply 1 application topically at bedtime. 12/21/14   Historical Provider, MD  levalbuterol Pauline Aus) 1.25 MG/3ML nebulizer solution Inhale 1 ampule into the lungs 4 (four) times daily. 12/23/14   Historical Provider, MD  ondansetron (ZOFRAN ODT) 4 MG disintegrating tablet Take 1 tablet (4 mg total) by mouth every 8 (eight) hours as needed for nausea  or vomiting. 04/27/15   Jerelyn Scott, MD  oxyCODONE (OXY IR/ROXICODONE) 5 MG immediate release tablet Take 0.5 tablets by mouth 2 (two) times daily as needed. pain 04/13/15   Historical Provider, MD  oxyCODONE-acetaminophen (PERCOCET/ROXICET) 5-325 MG per tablet Take 1-2 tablets by mouth every 6 (six) hours as needed for severe pain. 04/27/15   Jerelyn Scott, MD  pantoprazole (PROTONIX) 40 MG tablet Take 40 mg by mouth daily.    Historical Provider, MD  potassium chloride SA (K-DUR,KLOR-CON) 20 MEQ tablet Take 20 mEq by mouth daily.    Historical Provider, MD  torsemide (DEMADEX) 20 MG tablet Take 20 mg by mouth daily.    Historical Provider, MD   BP 141/49 mmHg   Pulse 54  Resp 18  SpO2 89% Physical Exam  Constitutional: She is oriented to person, place, and time. She appears well-developed and well-nourished.  Non-toxic appearance. No distress.  HENT:  Head: Normocephalic and atraumatic.  Eyes: Conjunctivae, EOM and lids are normal. Pupils are equal, round, and reactive to light.  Neck: Normal range of motion. Neck supple. No tracheal deviation present. No thyroid mass present.  Cardiovascular: Normal rate, regular rhythm and normal heart sounds.  Exam reveals no gallop.   No murmur heard. Pulmonary/Chest: Effort normal. No stridor. No respiratory distress. She has decreased breath sounds. She has no wheezes. She has no rhonchi. She has no rales.  Abdominal: Soft. Normal appearance and bowel sounds are normal. She exhibits no distension. There is no tenderness. There is no rebound and no CVA tenderness.  Musculoskeletal: Normal range of motion. She exhibits no edema or tenderness.  Neurological: She is alert and oriented to person, place, and time. She has normal strength. No cranial nerve deficit or sensory deficit. GCS eye subscore is 4. GCS verbal subscore is 5. GCS motor subscore is 6.  Skin: Skin is warm and dry. No abrasion and no rash noted.  Psychiatric: She has a normal mood and affect. Her speech is normal and behavior is normal.  Nursing note and vitals reviewed.   ED Course  Procedures (including critical care time) Labs Review Labs Reviewed  BASIC METABOLIC PANEL - Abnormal; Notable for the following:    Glucose, Bld 164 (*)    BUN 44 (*)    Creatinine, Ser 1.90 (*)    Calcium 8.8 (*)    GFR calc non Af Amer 23 (*)    GFR calc Af Amer 27 (*)    All other components within normal limits  CBC - Abnormal; Notable for the following:    RBC 3.38 (*)    Hemoglobin 9.0 (*)    HCT 29.1 (*)    All other components within normal limits  URINE CULTURE  TROPONIN I  BRAIN NATRIURETIC PEPTIDE  URINALYSIS, ROUTINE W REFLEX MICROSCOPIC  (NOT AT [facility])    Imaging Review Dg Chest 2 View  05/14/2015   CLINICAL DATA:  shortness of breath.  Recent right pleural effusion.  EXAM: CHEST  2 VIEW  COMPARISON:  04/27/2015, 05/08/2015.  FINDINGS: prior median sternotomy. Small pleural effusions noted bilaterally, larger on the right. There is associated bibasilar atelectasis. Upper lungs remain clear. No pneumothorax. Atherosclerosis of the aorta. Degenerative changes of the left shoulder and spine. No compression fracture  IMPRESSION: Cardiomegaly with persistent small effusions, large on the right. Associated bibasilar atelectasis. Stable postop changes.   Electronically Signed   By: Judie Petit.  Shick M.D.   On: 05/14/2015 14:35     EKG Interpretation  Date/Time:  Wednesday May 14 2015 14:02:03 EDT Ventricular Rate:  53 PR Interval:  280 QRS Duration: 170 QT Interval:  522 QTC Calculation: 490 R Axis:   -151 Text Interpretation:  Sinus rhythm Prolonged PR interval Nonspecific  intraventricular conduction delay Anterior infarct, old No significant  change since last tracing Confirmed by KOHUT  MD, STEPHEN ([id number]) on  05/14/2015 2:08:09 PM      MDM   Final diagnoses:  None    Patient with evidence of worsening renal function as well as CHF. She also is likely having a COPD exacerbation and was given albuterol with Atrovent but she has refused to take prednisone. Patient will be admitted for further management of these conditions  Lorre NickAnthony Laurella Tull, MD 05/14/15 [phone]

## 2015-05-15 ENCOUNTER — Inpatient Hospital Stay (HOSPITAL_COMMUNITY): Payer: Medicare Other

## 2015-05-15 LAB — LIPID PANEL
Cholesterol: 121 mg/dL (ref 0–200)
HDL: 21 mg/dL — AB (ref >40)
LDL Cholesterol: 49 mg/dL (ref 0–99)
TRIGLYCERIDES: 254 mg/dL — AB (ref <150)
Total CHOL/HDL Ratio: 5.8 RATIO
VLDL: 51 mg/dL — AB (ref 0–40)

## 2015-05-15 LAB — GLUCOSE, CAPILLARY
GLUCOSE-CAPILLARY: 160 mg/dL — AB (ref 65–99)
GLUCOSE-CAPILLARY: 137 mg/dL — AB (ref 65–99)
Glucose-Capillary: 130 mg/dL — ABNORMAL HIGH (ref 65–99)
Glucose-Capillary: 185 mg/dL — ABNORMAL HIGH (ref 65–99)

## 2015-05-15 LAB — COMPREHENSIVE METABOLIC PANEL
ALT: 6 U/L — AB (ref 14–54)
AST: 13 U/L — ABNORMAL LOW (ref 15–41)
Albumin: 3.2 g/dL — ABNORMAL LOW (ref 3.5–5.0)
Alkaline Phosphatase: 43 U/L (ref 38–126)
Anion gap: 6 (ref 5–15)
BUN: 43 mg/dL — ABNORMAL HIGH (ref 6–20)
CHLORIDE: 106 mmol/L (ref 101–111)
CO2: 30 mmol/L (ref 22–32)
CREATININE: 1.87 mg/dL — AB (ref 0.44–1.00)
Calcium: 8.4 mg/dL — ABNORMAL LOW (ref 8.9–10.3)
GFR calc Af Amer: 27 mL/min — ABNORMAL LOW (ref >60)
GFR calc non Af Amer: 24 mL/min — ABNORMAL LOW (ref >60)
Glucose, Bld: 163 mg/dL — ABNORMAL HIGH (ref 65–99)
Potassium: 3.9 mmol/L (ref 3.5–5.1)
SODIUM: 142 mmol/L (ref 135–145)
TOTAL PROTEIN: 6 g/dL — AB (ref 6.5–8.1)
Total Bilirubin: 0.4 mg/dL (ref 0.3–1.2)

## 2015-05-15 LAB — CBC
HCT: 27.7 % — ABNORMAL LOW (ref 36.0–46.0)
Hemoglobin: 8.6 g/dL — ABNORMAL LOW (ref 12.0–15.0)
MCH: 27.2 pg (ref 26.0–34.0)
MCHC: 31 g/dL (ref 30.0–36.0)
MCV: 87.7 fL (ref 78.0–100.0)
PLATELETS: 177 10*3/uL (ref 150–400)
RBC: 3.16 MIL/uL — ABNORMAL LOW (ref 3.87–5.11)
RDW: 15.3 % (ref 11.5–15.5)
WBC: 5.6 10*3/uL (ref 4.0–10.5)

## 2015-05-15 LAB — URINE CULTURE

## 2015-05-15 LAB — TROPONIN I
Troponin I: 0.03 ng/mL (ref <0.031)
Troponin I: 0.04 ng/mL — ABNORMAL HIGH (ref <0.031)

## 2015-05-15 MED ORDER — LEVALBUTEROL HCL 0.63 MG/3ML IN NEBU
0.6300 mg | INHALATION_SOLUTION | Freq: Four times a day (QID) | RESPIRATORY_TRACT | Status: DC
  Administered 2015-05-15 – 2015-05-19 (×8): 0.63 mg via RESPIRATORY_TRACT
  Filled 2015-05-15 (×13): qty 3

## 2015-05-15 MED ORDER — SODIUM CHLORIDE 0.9 % IV SOLN
1.0000 g | INTRAVENOUS | Status: DC
  Administered 2015-05-15: 1 g via INTRAVENOUS
  Filled 2015-05-15: qty 1

## 2015-05-15 MED ORDER — IPRATROPIUM BROMIDE 0.02 % IN SOLN
0.5000 mg | Freq: Four times a day (QID) | RESPIRATORY_TRACT | Status: DC
  Administered 2015-05-15 – 2015-05-19 (×8): 0.5 mg via RESPIRATORY_TRACT
  Filled 2015-05-15 (×13): qty 2.5

## 2015-05-15 NOTE — Progress Notes (Signed)
[facility] PROGRESS NOTE  Keonda Dow ZOX:[id number] DOB: [birth year] DOA: 05/14/2015 PCP: No primary care provider on file.  Assessment/Plan: 1. SOB suspect secondary to acute CHF with copd exacerbation 1. CXR noted with cardiomegaly with small effusions, otherwise clear 2. Pt on baseline 2.5L [place] prior to admit 3. Elevated BNP on admit in the setting of documented chf hx and presenting sob 4. Will cont nebs, diuretics per below 2. Suspected acute CHF, unknown if systolic vs diastolic 1. Elevated BNP this admission in the setting of increased sob 2. Patient is continued on scheduled IV lasix with good urine output. Difficult to measure i/o as pt is incontinent 3. Will continue daily weights 4. 2d echo was ordered, however pt refused 5. TSH normal 3. Hx CAD s/p CABG in 2012 1. Pt continues chest pain, nausea, diaphoresis 2. Trop serially negative 3. 2d echo still pending per above 4. Cont ASA 4. Possible mild COPD exacerbation 1. Will cont on duonebs PRN 2. No wheezing 3. Pt had declined steroids 5. DM2 1. Will cont on SSI coverage 2. Will check a1c 6. HTN 1. BP poorly controlled 2. Cont home meds for now with exception of ARB secondary to ARF 3. Add PRN hydralazine for sbp>180 7. ARF 1. Avoid nephrotoxic agents 2. Renal function slightly improved today 8. GERD 1. Stable 2. Cont PPI 9. DVT prophylaxis 1. Heparin subQ while admitted 10. UTI, possibly ESBL ecoli 1. Recent UTI with ESBL ecoli organism 2. Afebrile 3. Discussed with ID. Given allergy hx, recs for ertapenem  Code Status: Full Family Communication: Pt in room, family at bedside Disposition Plan: Pending   Consultants:    Procedures:    Antibiotics:  Ertapenem 7/21>>>  HPI/Subjective: Complains of mild abd soreness  Objective: Filed Vitals:   05/15/15 0633 05/15/15 1050 05/15/15 1105 05/15/15 1214  BP: 196/77 192/72  116/36  Pulse: 57   51  Temp: 98.3 F (36.8 C)   98.1 F (36.7  C)  TempSrc: Oral   Oral  Resp: 20   20  Height: 5\' 4"  (1.626 m)     Weight:   80.151 kg (176 lb 11.2 oz)   SpO2: 95%   96%    Intake/Output Summary (Last 24 hours) at 05/15/15 1446 Last data filed at 05/15/15 1354  Gross per 24 hour  Intake    120 ml  Output      0 ml  Net    120 ml   Filed Weights   05/15/15 1105  Weight: 80.151 kg (176 lb 11.2 oz)    Exam:   General:  Awake, in nad  Cardiovascular: regular, s1, s2  Respiratory: normal resp effort, no wheezing  Abdomen: soft,nondistended  Musculoskeletal: perfused, no clubbing   Data Reviewed: Basic Metabolic Panel:  Recent Labs Lab 05/14/15 1454 05/15/15 0200  NA 142 142  K 4.1 3.9  CL 105 106  CO2 28 30  GLUCOSE 164* 163*  BUN 44* 43*  CREATININE 1.90* 1.87*  CALCIUM 8.8* 8.4*   Liver Function Tests:  Recent Labs Lab 05/15/15 0200  AST 13*  ALT 6*  ALKPHOS 43  BILITOT 0.4  PROT 6.0*  ALBUMIN 3.2*   No results for input(s): LIPASE, AMYLASE in the last 168 hours. No results for input(s): AMMONIA in the last 168 hours. CBC:  Recent Labs Lab 05/14/15 1454 05/15/15 0200  WBC 6.3 5.6  HGB 9.0* 8.6*  HCT 29.1* 27.7*  MCV 86.1 87.7  PLT 179 177   Cardiac Enzymes:  Recent Labs Lab 05/14/15 1454 05/14/15 2045 05/15/15 0200 05/15/15 0820  TROPONINI 0.03 0.03 0.03 0.04*   BNP (last 3 results)  Recent Labs  05/14/15 1454  BNP 1487.4*    ProBNP (last 3 results) No results for input(s): PROBNP in the last 8760 hours.  CBG:  Recent Labs Lab 05/14/15 2203 05/15/15 0747 05/15/15 1147  GLUCAP 177* 130* 160*    Recent Results (from the past 240 hour(s))  Urine culture     Status: None   Collection Time: 05/08/15  8:56 PM  Result Value Ref Range Status   Specimen Description URINE, CATHETERIZED  Final   Special Requests NONE  Final   Culture   Final    >=100,000 COLONIES/mL ESCHERICHIA COLI Confirmed Extended Spectrum Beta-Lactamase Producer (ESBL) Performed at [facility]    Report Status 05/11/2015 FINAL  Final   Organism ID, Bacteria ESCHERICHIA COLI  Final      Susceptibility   Escherichia coli - MIC*    AMPICILLIN >=32 RESISTANT Resistant     CEFAZOLIN >=64 RESISTANT Resistant     CEFTRIAXONE >=64 RESISTANT Resistant     CIPROFLOXACIN >=4 RESISTANT Resistant     GENTAMICIN >=16 RESISTANT Resistant     IMIPENEM <=0.25 SENSITIVE Sensitive     NITROFURANTOIN <=16 SENSITIVE Sensitive     TRIMETH/SULFA <=20 SENSITIVE Sensitive     AMPICILLIN/SULBACTAM >=32 RESISTANT Resistant     PIP/TAZO 8 SENSITIVE Sensitive     * >=100,000 COLONIES/mL ESCHERICHIA COLI     Studies: Dg Chest 2 View  05/14/2015   CLINICAL DATA:  shortness of breath.  Recent right pleural effusion.  EXAM: CHEST  2 VIEW  COMPARISON:  04/27/2015, 05/08/2015.  FINDINGS: prior median sternotomy. Small pleural effusions noted bilaterally, larger on the right. There is associated bibasilar atelectasis. Upper lungs remain clear. No pneumothorax. Atherosclerosis of the aorta. Degenerative changes of the left shoulder and spine. No compression fracture  IMPRESSION: Cardiomegaly with persistent small effusions, large on the right. Associated bibasilar atelectasis. Stable postop changes.   Electronically Signed   By: Judie Petit.  Shick M.D.   On: 05/14/2015 14:35    Scheduled Meds: . amLODipine  10 mg Oral Daily  . anastrozole  1 mg Oral Daily  . aspirin EC  81 mg Oral Daily  . cloNIDine  0.2 mg Oral BID  . ertapenem  1 g Intravenous Q24H  . furosemide  40 mg Intravenous Daily  . heparin  5,000 Units Subcutaneous 3 times per day  . hydrALAZINE  100 mg Oral TID  . insulin aspart  0-15 Units Subcutaneous TID WC  . insulin aspart  0-5 Units Subcutaneous QHS  . pantoprazole  40 mg Oral Daily  . sodium chloride  3 mL Intravenous Q12H   Continuous Infusions:   Active Problems:   CHF (congestive heart failure)   Diabetes mellitus type 2 without retinopathy   HTN (hypertension)   COPD with  acute exacerbation   ARF (acute renal failure)   Acute CHF   Treshon Stannard K  [facility] Pager [phone]. If 7PM-7AM, please contact night-coverage at [email or web address], password [facility] 05/15/2015, 2:46 PM  LOS: 1 day

## 2015-05-15 NOTE — Progress Notes (Signed)
Pt refused to have bed linen changed. Educated on skin break down. Telephone to Gloria(daughter) to inform her of  mother's refusal to have wet linen changed. Staff will continue to monitor. Roosvelt Maser, RN

## 2015-05-15 NOTE — Progress Notes (Signed)
RN went to give subcutaneous Heparin as per MD Orders.  Pt became very angry and rude to staff "I have had that already today this afternoon. RN attempted to explain to Pt that she has received a dose early am but it is scheduled every 8hrs and a dose is due now. Pt states "I hate liars and I know I had that shot. You are wrong." Daughter shown on the computer Pt's dosages of Heparin and the times that these were administered. Pt with Daughter's encouragement allowed RN to give Heparin"

## 2015-05-15 NOTE — Evaluation (Signed)
Occupational Therapy Evaluation Patient Details Name: Leslie Parrish MRN: [id number] DOB: 03-09-1928 Today's Date: 05/15/2015    History of Present Illness pt was admitted with suspected CHF and COPD exacerbations as well as UTI.  she has a h/o CAD, CABG, DM and HTN   Clinical Impression   This 81 year old female was admitted for the above.  At baseline, she lives with daughter and has extensive assistance for adls and performs SPT to [facility] with assist x 1.  Pt will benefit from skilled OT to work on SPT to [facility] for safe discharge home.  Will also work on lean vs stand for hygiene    Follow Up Recommendations  Supervision/Assistance - 24 hour    Equipment Recommendations  None recommended by OT    Recommendations for Other Services       Precautions / Restrictions Precautions Precautions: Fall Restrictions Weight Bearing Restrictions: No      Mobility Bed Mobility Overal bed mobility: +2 for physical assistance;Needs Assistance Bed Mobility: Supine to Sit     Supine to sit: Mod assist;+2 for physical assistance     General bed mobility comments: assist for LEs and trunk.  Pt likes to be assisted under her arms to lift trunk up.  She and daughters have a routine  Transfers Overall transfer level: Needs assistance Equipment used: 2 person hand held assist Transfers: Sit to/from [employer] Sit to Stand: Mod assist;+2 physical assistance Stand pivot transfers: Mod assist;+2 physical assistance       General transfer comment: pt was anxious.  One daughter assisted at pt's request    Balance                                            ADL Overall ADL's : Needs assistance/impaired                                       General ADL Comments: daughter's help with pt's adls. She is near baseline for these; however, she needs +2 A for SPT and usually is assist x 1.  Pt was able to sit EOB x  at least 15 minutes with  supervision     Vision     Perception     Praxis      Pertinent Vitals/Pain Pain Assessment: Faces Faces Pain Scale: Hurts little more Pain Location: abdomen Pain Intervention(s): Limited activity within patient's tolerance;Monitored during session;Repositioned     Hand Dominance     Extremity/Trunk Assessment Upper Extremity Assessment Upper Extremity Assessment: LUE deficits/detail LUE Deficits / Details: painful shoulder able to lift approximately 20 degrees           Communication Communication Communication: No difficulties   Cognition Arousal/Alertness: Awake/alert Behavior During Therapy: Anxious Overall Cognitive Status: Within Functional Limits for tasks assessed                     General Comments       Exercises       Shoulder Instructions      Home Living Family/patient expects to be discharged to:: Private residence Living Arrangements: Children                           Home Equipment: Bedside  commode   Additional Comments: lives full time with one daughter and 2 others assist.  Pt has a lot of assistance with adls.  Able to SPT to chair and BSC      Prior Functioning/Environment Level of Independence: Needs assistance        Comments: daughters assist extensively.  Pt does not walk; SPT only    OT Diagnosis: Generalized weakness   OT Problem List: Decreased strength;Decreased activity tolerance;Impaired balance (sitting and/or standing);Pain;Cardiopulmonary status limiting activity   OT Treatment/Interventions: Self-care/ADL training;DME and/or AE instruction;Patient/family education;Balance training;Therapeutic activities    OT Goals(Current goals can be found in the care plan section) Acute Rehab OT Goals Patient Stated Goal: get rid of infection OT Goal Formulation: With patient/family Time For Goal Achievement: 05/22/15 Potential to Achieve Goals: Good ADL Goals Pt Will Transfer to Toilet: with mod  assist;bedside commode;stand pivot transfer Additional ADL Goal #1: pt will safely lean vs stand with min A for toilet hygiene by caregiver  OT Frequency: Min 2X/week   Barriers to D/C:            Co-evaluation PT/OT/SLP Co-Evaluation/Treatment: Yes Reason for Co-Treatment: For patient/therapist safety PT goals addressed during session: Mobility/safety with mobility OT goals addressed during session:  (mobility related to adls)      End of Session Nurse Communication: Mobility status;Need for lift equipment (maximove pad placed if needed)  Activity Tolerance: Patient tolerated treatment well Patient left: in chair;with call bell/phone within reach;with nursing/sitter in room;with family/visitor present   Time: 1308-6578 OT Time Calculation (min): 44 min Charges:  OT General Charges $OT Visit: 1 Procedure OT Evaluation $Initial OT Evaluation Tier I: 1 Procedure OT Treatments $Therapeutic Activity: 8-22 mins G-Codes:    Kely Dohn 2015-06-07, 3:13 PM   Marica Otter, OTR/L [phone] 2015-06-07

## 2015-05-15 NOTE — Evaluation (Signed)
Physical Therapy Evaluation Patient Details Name: Leslie Parrish MRN: [id number] DOB: [birth year] Today's Date: 05/15/2015   History of Present Illness  pt was admitted with suspected CHF and COPD exacerbations as well as UTI.  she has a h/o CAD, CABG, DM and HTN  Clinical Impression  On eval, pt required Mod assist +2 for mobility.Sat EOB at least 15 minutes with Min guard assist. Pt agreeable to transfer to recliner with Max encouragement from daughter. Pt became anxious so daughter assisted OT with getting pt to recliner. Recommend HHPT and 24 hour supervision/assist, if pt/family are agreeable to this.     Follow Up Recommendations Home health PT;Supervision/Assistance - 24 hour (if pt/family agreeable)    Equipment Recommendations  None recommended by PT    Recommendations for Other Services OT consult     Precautions / Restrictions Precautions Precautions: Fall Precaution Comments: prefers to be lifted under armpits. Pt/family very particular about how she is assisted Restrictions Weight Bearing Restrictions: No      Mobility  Bed Mobility Overal bed mobility: +2 for physical assistance;Needs Assistance Bed Mobility: Supine to Sit     Supine to sit: Mod assist;+2 for physical assistance     General bed mobility comments: assist for LEs and trunk.  Pt likes to be assisted under her arms to lift trunk up  Transfers Overall transfer level: Needs assistance Equipment used: 2 person hand held assist Transfers: Sit to/from [employer] Sit to Stand: Mod assist;+2 physical assistance Stand pivot transfers: Mod assist;+2 physical assistance       General transfer comment: pt was anxious.  One daughter assisted at pt's request.   Ambulation/Gait             General Gait Details: NT-pt non ambulatory  Stairs            Wheelchair Mobility    Modified Rankin (Stroke Patients Only)       Balance                                              Pertinent Vitals/Pain Pain Assessment: Faces Faces Pain Scale: Hurts little more Pain Location: abdomen Pain Descriptors / Indicators: Sore Pain Intervention(s): Limited activity within patient's tolerance;Monitored during session;Repositioned    Home Living Family/patient expects to be discharged to:: Private residence Living Arrangements: Children             Home Equipment: Bedside commode Additional Comments: lives full time with one daughter and 2 others assist.  Pt has a lot of assistance with adls.  Able to SPT to chair and BSC    Prior Function Level of Independence: Needs assistance         Comments: daughters assist extensively.  Pt does not walk; SPT only     Hand Dominance        Extremity/Trunk Assessment   Upper Extremity Assessment: Defer to OT evaluation       LUE Deficits / Details: painful shoulder able to lift approximately 20 degrees   Lower Extremity Assessment: Generalized weakness      Cervical / Trunk Assessment: Kyphotic  Communication   Communication: No difficulties  Cognition Arousal/Alertness: Awake/alert Behavior During Therapy: Anxious (pt/familyabrasive at times. Saying very unpleasant things about RN during session) Overall Cognitive Status: Within Functional Limits for tasks assessed  General Comments      Exercises        Assessment/Plan    PT Assessment Patient needs continued PT services  PT Diagnosis Generalized weakness;Acute pain   PT Problem List Decreased strength;Decreased activity tolerance;Decreased balance;Decreased mobility;Pain  PT Treatment Interventions Functional mobility training;Therapeutic activities;Therapeutic exercise;Patient/family education;Balance training   PT Goals (Current goals can be found in the Care Plan section) Acute Rehab PT Goals Patient Stated Goal: get rid of infection PT Goal Formulation: With patient/family Time For Goal  Achievement: 05/29/15 Potential to Achieve Goals: Fair    Frequency Min 3X/week   Barriers to discharge        Co-evaluation PT/OT/SLP Co-Evaluation/Treatment: Yes Reason for Co-Treatment: For patient/therapist safety PT goals addressed during session: Mobility/safety with mobility;Balance OT goals addressed during session:  (mobility related to adls)       End of Session   Activity Tolerance: Patient tolerated treatment well Patient left: in chair;with call bell/phone within reach;with family/visitor present           Time: 1610-9604 PT Time Calculation (min) (ACUTE ONLY): 44 min   Charges:   PT Evaluation $Initial PT Evaluation Tier I: 1 Procedure     PT G Codes:        Rebeca Alert, MPT Pager: [phone]

## 2015-05-15 NOTE — Progress Notes (Signed)
Dr. Rhona Leavens making rounds given update Pt refusing sliding scale insulin dosage. Pt states "That is not what I am taking at home and I will not take it" Pt and Daughter given teaching regarding Sliding Scale Insulin but Pt will not take Insulin

## 2015-05-15 NOTE — CV Procedure (Signed)
Patient is refusing the echocardiogram at this time.   Leslie Parrish [facility] 05/15/2015 8:59 AM

## 2015-05-16 ENCOUNTER — Inpatient Hospital Stay (HOSPITAL_COMMUNITY): Payer: Medicare Other

## 2015-05-16 DIAGNOSIS — I5041 Acute combined systolic (congestive) and diastolic (congestive) heart failure: Principal | ICD-10-CM

## 2015-05-16 DIAGNOSIS — I509 Heart failure, unspecified: Secondary | ICD-10-CM

## 2015-05-16 LAB — CBC
HEMATOCRIT: 27.9 % — AB (ref 36.0–46.0)
Hemoglobin: 8.6 g/dL — ABNORMAL LOW (ref 12.0–15.0)
MCH: 27 pg (ref 26.0–34.0)
MCHC: 30.8 g/dL (ref 30.0–36.0)
MCV: 87.5 fL (ref 78.0–100.0)
Platelets: 183 10*3/uL (ref 150–400)
RBC: 3.19 MIL/uL — ABNORMAL LOW (ref 3.87–5.11)
RDW: 15.3 % (ref 11.5–15.5)
WBC: 5.8 10*3/uL (ref 4.0–10.5)

## 2015-05-16 LAB — BASIC METABOLIC PANEL
ANION GAP: 8 (ref 5–15)
BUN: 46 mg/dL — AB (ref 6–20)
CHLORIDE: 103 mmol/L (ref 101–111)
CO2: 30 mmol/L (ref 22–32)
CREATININE: 1.89 mg/dL — AB (ref 0.44–1.00)
Calcium: 8.5 mg/dL — ABNORMAL LOW (ref 8.9–10.3)
GFR calc Af Amer: 27 mL/min — ABNORMAL LOW (ref >60)
GFR calc non Af Amer: 23 mL/min — ABNORMAL LOW (ref >60)
Glucose, Bld: 126 mg/dL — ABNORMAL HIGH (ref 65–99)
Potassium: 3.9 mmol/L (ref 3.5–5.1)
Sodium: 141 mmol/L (ref 135–145)

## 2015-05-16 LAB — GLUCOSE, CAPILLARY
GLUCOSE-CAPILLARY: 114 mg/dL — AB (ref 65–99)
GLUCOSE-CAPILLARY: 132 mg/dL — AB (ref 65–99)
Glucose-Capillary: 167 mg/dL — ABNORMAL HIGH (ref 65–99)
Glucose-Capillary: 174 mg/dL — ABNORMAL HIGH (ref 65–99)

## 2015-05-16 LAB — HEMOGLOBIN A1C
Hgb A1c MFr Bld: 6.2 % — ABNORMAL HIGH (ref 4.8–5.6)
Mean Plasma Glucose: 131 mg/dL

## 2015-05-16 MED ORDER — SODIUM CHLORIDE 0.9 % IV SOLN
500.0000 mg | INTRAVENOUS | Status: DC
  Administered 2015-05-16 – 2015-05-18 (×3): 0.5 g via INTRAVENOUS
  Filled 2015-05-16 (×5): qty 0.5

## 2015-05-16 MED ORDER — TORSEMIDE 20 MG PO TABS
20.0000 mg | ORAL_TABLET | Freq: Every day | ORAL | Status: DC
Start: 2015-05-16 — End: 2015-05-17
  Administered 2015-05-16: 20 mg via ORAL
  Filled 2015-05-16 (×2): qty 1

## 2015-05-16 MED ORDER — BISACODYL 10 MG RE SUPP
10.0000 mg | Freq: Once | RECTAL | Status: AC
  Administered 2015-05-16: 10 mg via RECTAL
  Filled 2015-05-16: qty 1

## 2015-05-16 NOTE — Progress Notes (Signed)
  Echocardiogram 2D Echocardiogram has been performed.  Leta Jungling M 05/16/2015, 8:54 AM

## 2015-05-16 NOTE — Progress Notes (Signed)
[facility] PROGRESS NOTE  Emrie Gayle UJW:[id number] DOB: 1928/04/11 DOA: 05/14/2015 PCP: No primary care provider on file.  Assessment/Plan: 1. SOB suspect secondary to acute CHF with copd exacerbation 1. CXR noted with cardiomegaly with small effusions, otherwise clear  2. Pt on baseline 2.5L [place] prior to admit 3. Elevated BNP on admit in the setting of documented chf hx and presenting sob 4. Will cont nebs as scheduled 5. Changed IV lasix to PO home torsemide  2. Suspected acute systolic/diastolic CHF 1. Elevated BNP this admission in the setting of increased sob 2. Patient was continued on scheduled IV lasix with good urine output. 3. Will continue daily weights dry wt seems to be around 79-80kg 4. 2d echo with EF 30-35% with grade 2 diastolic dysfunction 5. TSH normal 3. Hx CAD s/p CABG in 2012 1. Pt continues chest pain, nausea, diaphoresis 2. Trop serially negative 3. 2d echo without WMA 4. Cont ASA 4. Possible mild COPD exacerbation 1. Will cont on duonebs PRN 2. No wheezing 3. Pt had declined steroids 5. DM2 1. Will cont on SSI coverage 2. A1c 6.2 6. HTN 1. BP poorly controlled 2. Cont home meds for now with exception of ARB secondary to ARF 3. Cont PRN hydralazine for sbp>180 7. ARF 1. Avoid nephrotoxic agents 2. Renal function stable 3. Cont to monitor 8. GERD 1. Stable 2. Cont PPI 9. DVT prophylaxis 1. Heparin subQ while admitted 10. UTI, possibly ESBL ecoli 1. Recent UTI with ESBL ecoli organism 2. Afebrile, pt with lower quadrant pain that has improved since starting abx 3. Discussed with ID. Given allergy hx, continue 5 days of Invanz then stop (3 more days)  Code Status: Full Family Communication: Pt in room, family over phone Disposition Plan: Poss home 7/25 if stable   Consultants:    Procedures:    Antibiotics:  Ertapenem 7/21>>>  HPI/Subjective: Denies chest pain. Abd pain improved  Objective: Filed Vitals:   05/16/15  0554 05/16/15 0759 05/16/15 1428 05/16/15 1512  BP: 134/56  141/64   Pulse: 58  62   Temp: 97.8 F (36.6 C)  97.4 F (36.3 C)   TempSrc: Oral  Oral   Resp: 20  23   Height:      Weight: 80 kg (176 lb 5.9 oz)     SpO2: 95% 98% 99% 99%    Intake/Output Summary (Last 24 hours) at 05/16/15 1549 Last data filed at 05/16/15 1244  Gross per 24 hour  Intake    530 ml  Output      0 ml  Net    530 ml   Filed Weights   05/15/15 1105 05/16/15 0554  Weight: 80.151 kg (176 lb 11.2 oz) 80 kg (176 lb 5.9 oz)    Exam:   General:  Awake, sitting in bed, in nad  Cardiovascular: regular, s1, s2  Respiratory: normal resp effort, no wheezing  Abdomen: soft,nondistended  Musculoskeletal: perfused, no clubbing, no LE edema  Data Reviewed: Basic Metabolic Panel:  Recent Labs Lab 05/14/15 1454 05/15/15 0200 05/16/15 0417  NA 142 142 141  K 4.1 3.9 3.9  CL 105 106 103  CO2 28 30 30   GLUCOSE 164* 163* 126*  BUN 44* 43* 46*  CREATININE 1.90* 1.87* 1.89*  CALCIUM 8.8* 8.4* 8.5*   Liver Function Tests:  Recent Labs Lab 05/15/15 0200  AST 13*  ALT 6*  ALKPHOS 43  BILITOT 0.4  PROT 6.0*  ALBUMIN 3.2*   No results for input(s): LIPASE,  AMYLASE in the last 168 hours. No results for input(s): AMMONIA in the last 168 hours. CBC:  Recent Labs Lab 05/14/15 1454 05/15/15 0200 05/16/15 0417  WBC 6.3 5.6 5.8  HGB 9.0* 8.6* 8.6*  HCT 29.1* 27.7* 27.9*  MCV 86.1 87.7 87.5  PLT 179 177 183   Cardiac Enzymes:  Recent Labs Lab 05/14/15 1454 05/14/15 2045 05/15/15 0200 05/15/15 0820  TROPONINI 0.03 0.03 0.03 0.04*   BNP (last 3 results)  Recent Labs  05/14/15 1454  BNP 1487.4*    ProBNP (last 3 results) No results for input(s): PROBNP in the last 8760 hours.  CBG:  Recent Labs Lab 05/15/15 1147 05/15/15 1639 05/15/15 2158 05/16/15 0752 05/16/15 1154  GLUCAP 160* 137* 185* 114* 167*    Recent Results (from the past 240 hour(s))  Urine culture      Status: None   Collection Time: 05/08/15  8:56 PM  Result Value Ref Range Status   Specimen Description URINE, CATHETERIZED  Final   Special Requests NONE  Final   Culture   Final    >=100,000 COLONIES/mL ESCHERICHIA COLI Confirmed Extended Spectrum Beta-Lactamase Producer (ESBL) Performed at [facility]    Report Status 05/11/2015 FINAL  Final   Organism ID, Bacteria ESCHERICHIA COLI  Final      Susceptibility   Escherichia coli - MIC*    AMPICILLIN >=32 RESISTANT Resistant     CEFAZOLIN >=64 RESISTANT Resistant     CEFTRIAXONE >=64 RESISTANT Resistant     CIPROFLOXACIN >=4 RESISTANT Resistant     GENTAMICIN >=16 RESISTANT Resistant     IMIPENEM <=0.25 SENSITIVE Sensitive     NITROFURANTOIN <=16 SENSITIVE Sensitive     TRIMETH/SULFA <=20 SENSITIVE Sensitive     AMPICILLIN/SULBACTAM >=32 RESISTANT Resistant     PIP/TAZO 8 SENSITIVE Sensitive     * >=100,000 COLONIES/mL ESCHERICHIA COLI  Urine culture     Status: None   Collection Time: 05/14/15  5:26 PM  Result Value Ref Range Status   Specimen Description URINE, CLEAN CATCH  Final   Special Requests NONE  Final   Culture   Final    MULTIPLE SPECIES PRESENT, SUGGEST RECOLLECTION IF CLINICALLY INDICATED Performed at [facility]    Report Status 05/15/2015 FINAL  Final     Studies: No results found.  Scheduled Meds: . amLODipine  10 mg Oral Daily  . anastrozole  1 mg Oral Daily  . aspirin EC  81 mg Oral Daily  . cloNIDine  0.2 mg Oral BID  . ertapenem  500 mg Intravenous Q24H  . heparin  5,000 Units Subcutaneous 3 times per day  . hydrALAZINE  100 mg Oral TID  . insulin aspart  0-15 Units Subcutaneous TID WC  . insulin aspart  0-5 Units Subcutaneous QHS  . ipratropium  0.5 mg Nebulization Q6H  . levalbuterol  0.63 mg Nebulization Q6H  . pantoprazole  40 mg Oral Daily  . sodium chloride  3 mL Intravenous Q12H  . torsemide  20 mg Oral Daily   Continuous Infusions:   Active Problems:   CHF  (congestive heart failure)   Diabetes mellitus type 2 without retinopathy   HTN (hypertension)   COPD with acute exacerbation   ARF (acute renal failure)   Acute CHF   Styles Fambro K  [facility] Pager [phone]. If 7PM-7AM, please contact night-coverage at [email or web address], password [facility] 05/16/2015, 3:49 PM  LOS: 2 days

## 2015-05-16 NOTE — Care Management Important Message (Signed)
Important Message  Patient Details  Name: Leslie Parrish MRN: [id number] Date of Birth: 08-19-1928   Medicare Important Message Given:  Yes-second notification given    Renie Ora 05/16/2015, 2:20 PMImportant Message  Patient Details  Name: Leslie Parrish MRN: [id number] Date of Birth: 04/18/1928   Medicare Important Message Given:  Yes-second notification given    Renie Ora 05/16/2015, 2:20 PM

## 2015-05-16 NOTE — Progress Notes (Signed)
Pt refused her 02:00 breathing treatment. Pt stated that she did not want this treatment and instead wished to be allowed to continue sleeping,  I expressed to her that if she needed a breathing treatment between now and her next scheduled treatment at 08:00 that all she needed to do was call the RN and request one and RT would be contacted to immediately bring her a breathing treatment.  She expressed her understanding of this.  RT will continue to monitor as needed.

## 2015-05-16 NOTE — Progress Notes (Signed)
Spoke with pt concerning Home Health. Pt selected [facility], referral given to in house rep. Spoke with pt and daughter concerning PCP who's request was for [facility].  Follow up appointment with Dr. Genella Mech on August 23 at 3:30 PM.

## 2015-05-16 NOTE — Progress Notes (Signed)
Pt upset about staff or anyone making any noise on the unit. Pt refuses to have door closed and repeatedly calls to the nurses station to have staff tell other patients to "shut up." Pt wants to move rooms during the day to "some place more quiet" because she hasn't slept. This RN has found pateint sleeping several times tonight. Pt denies this despite snoring heard from patient. Will continue to monitor.

## 2015-05-16 NOTE — Progress Notes (Signed)
Respiratory protocol assessment performed on Pt.  Pt scored a 6 which is a severity score of 1.  But, Pt requested explicitly that she be left q6h scheduled.

## 2015-05-16 NOTE — Care Management Note (Signed)
Case Management Note  Patient Details  Name: Leslie Parrish MRN: [id number] Date of Birth: Oct 27, 1927  Subjective/Objective:   Pt admitted with CHF                Action/Plan:  From home going home with [facility]   Expected Discharge Date:   (unknown)               Expected Discharge Plan:  Home w Home Health Services  In-House Referral:     Discharge planning Services  CM Consult, Follow-up appt scheduled  Post Acute Care Choice:    Choice offered to:  Patient  DME Arranged:    DME Agency:     HH Arranged:  RN, PT, Nurse's Aide HH Agency:  [facility]  Status of Service:  In process, will continue to follow  Medicare Important Message Given:    Date Medicare IM Given:    Medicare IM give by:    Date Additional Medicare IM Given:    Additional Medicare Important Message give by:     If discussed at Long Length of Stay Meetings, dates discussed:    Additional Comments:Follow up appointment with Dr. Genella Mech on August 23 at 3:30 PM.  Geni Bers, RN 05/16/2015, 1:56 PM

## 2015-05-16 NOTE — Progress Notes (Signed)
Pt complaining about noise from other patient down hall who is confused. Pt yelling at nurse. Offered to close door, but patient yelled and cursed at nurse saying she is claustrophobic. RN offered earplugs to patient who refused. Pt continually cursed at RN. Explained situation and patient stated she didn't care and we needed to "shut that patient up" because he is "keeping me awake every night." Will continue to monitor.

## 2015-05-17 LAB — GLUCOSE, CAPILLARY
Glucose-Capillary: 125 mg/dL — ABNORMAL HIGH (ref 65–99)
Glucose-Capillary: 152 mg/dL — ABNORMAL HIGH (ref 65–99)
Glucose-Capillary: 172 mg/dL — ABNORMAL HIGH (ref 65–99)
Glucose-Capillary: 122 mg/dL — ABNORMAL HIGH (ref 65–99)

## 2015-05-17 LAB — CBC
HCT: 30 % — ABNORMAL LOW (ref 36.0–46.0)
Hemoglobin: 9.4 g/dL — ABNORMAL LOW (ref 12.0–15.0)
MCH: 27.6 pg (ref 26.0–34.0)
MCHC: 31.3 g/dL (ref 30.0–36.0)
MCV: 88 fL (ref 78.0–100.0)
Platelets: 203 10*3/uL (ref 150–400)
RBC: 3.41 MIL/uL — ABNORMAL LOW (ref 3.87–5.11)
RDW: 15.4 % (ref 11.5–15.5)
WBC: 5.5 10*3/uL (ref 4.0–10.5)

## 2015-05-17 LAB — BASIC METABOLIC PANEL
Anion gap: 9 (ref 5–15)
BUN: 52 mg/dL — ABNORMAL HIGH (ref 6–20)
CALCIUM: 8.8 mg/dL — AB (ref 8.9–10.3)
CO2: 29 mmol/L (ref 22–32)
Chloride: 102 mmol/L (ref 101–111)
Creatinine, Ser: 2 mg/dL — ABNORMAL HIGH (ref 0.44–1.00)
GFR calc Af Amer: 25 mL/min — ABNORMAL LOW (ref >60)
GFR calc non Af Amer: 22 mL/min — ABNORMAL LOW (ref >60)
GLUCOSE: 133 mg/dL — AB (ref 65–99)
POTASSIUM: 4.2 mmol/L (ref 3.5–5.1)
Sodium: 140 mmol/L (ref 135–145)

## 2015-05-17 MED ORDER — SODIUM CHLORIDE 0.9 % IV SOLN
INTRAVENOUS | Status: AC
  Administered 2015-05-17: 09:00:00 via INTRAVENOUS

## 2015-05-17 NOTE — Progress Notes (Signed)
Pt angry and hateful to staff, was upset about breakfast not being right, tech ordered what she had asked for. Pt yelled at nurse because the MD d/c her torsemide, notified MD. Pt yelled at nurse because she was "too slow". Pt refuses insulin. Pt has cussed at tech, dietary, and nurse, stated that "no one can do anything right", even after several attempts to do what the pt asks.  Lenox Ponds, RN

## 2015-05-17 NOTE — Progress Notes (Signed)
Pt got a hamburger for supper, ate it, and is now complaining that she was sent a dirty plate and did not it.  Lenox Ponds, RN

## 2015-05-17 NOTE — Progress Notes (Signed)
Pt has been verbally aggressive. RN went to take her nighttime medications and pt stated "I don't want them, I don't want anything. Leave me the hell alone. Turn the damn light off and let me sleep." So she did not receive her night time medications because she refused them. RN turned the light off and left the pt alone as she wished. Rockne Coons, RN

## 2015-05-17 NOTE — Progress Notes (Signed)
[facility] PROGRESS NOTE  Leslie Parrish:[id number] DOB: 1928-03-26 DOA: 05/14/2015 PCP: No primary care provider on file.  Assessment/Plan: 1. SOB suspect secondary to acute CHF with copd exacerbation 1. CXR noted with cardiomegaly with small effusions, otherwise clear  2. Pt on baseline 2.5L [place] prior to admit 3. Elevated BNP on admit in the setting of documented chf hx and presenting sob 4. Will cont nebs as scheduled 5. Changed IV lasix to PO home torsemide  2. Suspected acute systolic/diastolic CHF 1. Elevated BNP this admission in the setting of increased sob 2. Patient was continued on scheduled IV lasix with good urine output. 3. Will continue daily weights dry wt seems to be around 79-80kg 4. 2d echo with EF 30-35% with grade 2 diastolic dysfunction 5. TSH was normal  3. Hx CAD s/p CABG in 2012 1. Pt was continued chest pain, nausea, diaphoresis 2. Trop serially negative 3. 2d echo without WMA 4. Cont ASA 4. Possible mild COPD exacerbation 1. Will cont on duonebs PRN 2. No wheezing 3. Pt had declined steroids 5. DM2 1. Will cont on SSI coverage 2. A1c 6.2 6. HTN 1. BP poorly controlled 2. Cont home meds for now with exception of ARB secondary to ARF 3. Cont PRN hydralazine for sbp>180 7. ARF 1. Avoid nephrotoxic agents 2. Renal function slightly worse today 3. Cont to follow renal function closely 8. GERD 1. Stable 2. Cont PPI 9. DVT prophylaxis 1. Heparin subQ while admitted 10. UTI, possibly ESBL ecoli 1. Recent UTI with ESBL ecoli organism 2. Afebrile, pt with lower quadrant pain that has improved since starting abx 3. Discussed with ID. Given allergy hx, continue 5 days of Invanz then stop (2 more days)  Code Status: Full Family Communication: Pt in room, family over phone Disposition Plan: Home when stable   Consultants:    Procedures:    Antibiotics:  Ertapenem 7/21>>>  HPI/Subjective: No complaints this AM. No abd  pain  Objective: Filed Vitals:   05/17/15 0527 05/17/15 0844 05/17/15 1008 05/17/15 1339  BP: 149/38 148/60  136/46  Pulse: 58   50  Temp: 98 F (36.7 C)   97.8 F (36.6 C)  TempSrc: Oral   Oral  Resp: 19   19  Height:      Weight: 78.9 kg (173 lb 15.1 oz)     SpO2: 100%  100% 100%    Intake/Output Summary (Last 24 hours) at 05/17/15 1459 Last data filed at 05/17/15 1340  Gross per 24 hour  Intake    480 ml  Output      0 ml  Net    480 ml   Filed Weights   05/15/15 1105 05/16/15 0554 05/17/15 0527  Weight: 80.151 kg (176 lb 11.2 oz) 80 kg (176 lb 5.9 oz) 78.9 kg (173 lb 15.1 oz)    Exam:   General:  Awake, sitting in bed, in nad  Cardiovascular: regular, s1, s2  Respiratory: normal resp effort, no wheezing  Abdomen: soft,nondistended, pos BS  Musculoskeletal: perfused, no clubbing,  Data Reviewed: Basic Metabolic Panel:  Recent Labs Lab 05/14/15 1454 05/15/15 0200 05/16/15 0417 05/17/15 0513  NA 142 142 141 140  K 4.1 3.9 3.9 4.2  CL 105 106 103 102  CO2 28 30 30 29   GLUCOSE 164* 163* 126* 133*  BUN 44* 43* 46* 52*  CREATININE 1.90* 1.87* 1.89* 2.00*  CALCIUM 8.8* 8.4* 8.5* 8.8*   Liver Function Tests:  Recent Labs Lab 05/15/15 0200  AST 13*  ALT 6*  ALKPHOS 43  BILITOT 0.4  PROT 6.0*  ALBUMIN 3.2*   No results for input(s): LIPASE, AMYLASE in the last 168 hours. No results for input(s): AMMONIA in the last 168 hours. CBC:  Recent Labs Lab 05/14/15 1454 05/15/15 0200 05/16/15 0417 05/17/15 0513  WBC 6.3 5.6 5.8 5.5  HGB 9.0* 8.6* 8.6* 9.4*  HCT 29.1* 27.7* 27.9* 30.0*  MCV 86.1 87.7 87.5 88.0  PLT 179 177 183 203   Cardiac Enzymes:  Recent Labs Lab 05/14/15 1454 05/14/15 2045 05/15/15 0200 05/15/15 0820  TROPONINI 0.03 0.03 0.03 0.04*   BNP (last 3 results)  Recent Labs  05/14/15 1454  BNP 1487.4*    ProBNP (last 3 results) No results for input(s): PROBNP in the last 8760 hours.  CBG:  Recent Labs Lab  05/16/15 1154 05/16/15 1615 05/16/15 2141 05/17/15 0735 05/17/15 1204  GLUCAP 167* 132* 174* 125* 152*    Recent Results (from the past 240 hour(s))  Urine culture     Status: None   Collection Time: 05/08/15  8:56 PM  Result Value Ref Range Status   Specimen Description URINE, CATHETERIZED  Final   Special Requests NONE  Final   Culture   Final    >=100,000 COLONIES/mL ESCHERICHIA COLI Confirmed Extended Spectrum Beta-Lactamase Producer (ESBL) Performed at [facility]    Report Status 05/11/2015 FINAL  Final   Organism ID, Bacteria ESCHERICHIA COLI  Final      Susceptibility   Escherichia coli - MIC*    AMPICILLIN >=32 RESISTANT Resistant     CEFAZOLIN >=64 RESISTANT Resistant     CEFTRIAXONE >=64 RESISTANT Resistant     CIPROFLOXACIN >=4 RESISTANT Resistant     GENTAMICIN >=16 RESISTANT Resistant     IMIPENEM <=0.25 SENSITIVE Sensitive     NITROFURANTOIN <=16 SENSITIVE Sensitive     TRIMETH/SULFA <=20 SENSITIVE Sensitive     AMPICILLIN/SULBACTAM >=32 RESISTANT Resistant     PIP/TAZO 8 SENSITIVE Sensitive     * >=100,000 COLONIES/mL ESCHERICHIA COLI  Urine culture     Status: None   Collection Time: 05/14/15  5:26 PM  Result Value Ref Range Status   Specimen Description URINE, CLEAN CATCH  Final   Special Requests NONE  Final   Culture   Final    MULTIPLE SPECIES PRESENT, SUGGEST RECOLLECTION IF CLINICALLY INDICATED Performed at [facility]    Report Status 05/15/2015 FINAL  Final     Studies: No results found.  Scheduled Meds: . amLODipine  10 mg Oral Daily  . anastrozole  1 mg Oral Daily  . aspirin EC  81 mg Oral Daily  . cloNIDine  0.2 mg Oral BID  . ertapenem  500 mg Intravenous Q24H  . heparin  5,000 Units Subcutaneous 3 times per day  . hydrALAZINE  100 mg Oral TID  . insulin aspart  0-15 Units Subcutaneous TID WC  . insulin aspart  0-5 Units Subcutaneous QHS  . ipratropium  0.5 mg Nebulization Q6H  . levalbuterol  0.63 mg  Nebulization Q6H  . pantoprazole  40 mg Oral Daily  . sodium chloride  3 mL Intravenous Q12H   Continuous Infusions: . sodium chloride 75 mL/hr at 05/17/15 0845    Active Problems:   CHF (congestive heart failure)   Diabetes mellitus type 2 without retinopathy   HTN (hypertension)   COPD with acute exacerbation   ARF (acute renal failure)   Acute CHF   Leslie Parrish K  [facility] Pager [phone]. If 7PM-7AM, please contact night-coverage at [email or web address], password [facility] 05/17/2015, 2:59 PM  LOS: 3 days

## 2015-05-17 NOTE — Care Management (Signed)
Medicare Important Message given? YES  Date Medicare IM given:05/17/15  Medicare IM given by: Thelmer Legler RN CCM 

## 2015-05-18 ENCOUNTER — Inpatient Hospital Stay (HOSPITAL_COMMUNITY): Payer: Medicare Other

## 2015-05-18 LAB — BASIC METABOLIC PANEL
ANION GAP: 10 (ref 5–15)
BUN: 51 mg/dL — ABNORMAL HIGH (ref 6–20)
CALCIUM: 8.9 mg/dL (ref 8.9–10.3)
CO2: 27 mmol/L (ref 22–32)
CREATININE: 1.78 mg/dL — AB (ref 0.44–1.00)
Chloride: 105 mmol/L (ref 101–111)
GFR calc non Af Amer: 25 mL/min — ABNORMAL LOW (ref >60)
GFR, EST AFRICAN AMERICAN: 29 mL/min — AB (ref >60)
Glucose, Bld: 113 mg/dL — ABNORMAL HIGH (ref 65–99)
Potassium: 4.3 mmol/L (ref 3.5–5.1)
SODIUM: 142 mmol/L (ref 135–145)

## 2015-05-18 LAB — GLUCOSE, CAPILLARY
GLUCOSE-CAPILLARY: 120 mg/dL — AB (ref 65–99)
Glucose-Capillary: 116 mg/dL — ABNORMAL HIGH (ref 65–99)
Glucose-Capillary: 115 mg/dL — ABNORMAL HIGH (ref 65–99)
Glucose-Capillary: 123 mg/dL — ABNORMAL HIGH (ref 65–99)

## 2015-05-18 MED ORDER — TORSEMIDE 20 MG PO TABS
20.0000 mg | ORAL_TABLET | Freq: Every day | ORAL | Status: DC
  Administered 2015-05-19: 20 mg via ORAL
  Filled 2015-05-18: qty 1

## 2015-05-18 MED ORDER — SORBITOL 70 % SOLN
960.0000 mL | TOPICAL_OIL | Freq: Once | ORAL | Status: AC
  Administered 2015-05-18: 960 mL via RECTAL
  Filled 2015-05-18: qty 240

## 2015-05-18 NOTE — Progress Notes (Signed)
[facility] PROGRESS NOTE  Leslie Parrish ZOX:[id number] DOB: [birth year] DOA: 05/14/2015 PCP: No primary care provider on file.  Assessment/Plan: 1. SOB suspect secondary to acute CHF with copd exacerbation 1. CXR noted with cardiomegaly with small effusions, otherwise clear  2. Pt on baseline 2.5L [place] prior to admit 3. Elevated BNP on admit in the setting of documented chf hx and presenting sob 4. Pt is cont on nebs as scheduled 5. Holding diuretic for now, anticipate resuming tomorrow  2. Suspected acute systolic/diastolic CHF 1. Elevated BNP this admission in the setting of increased sob 2. Patient was continued on scheduled IV lasix with good urine output. 3. Transition to PO lasix tomorrow 4. Will continue daily weights dry wt seems to be around 79-80kg 5. 2d echo with EF 30-35% with grade 2 diastolic dysfunction which is pt's baseline EF 6. TSH was normal  3. Hx CAD s/p CABG in 2012 1. Pt was continued chest pain, nausea, diaphoresis 2. Trop serially negative 3. 2d echo without WMA 4. Cont ASA 4. Possible mild COPD exacerbation 1. Will cont on duonebs PRN 2. No wheezing 3. Pt had declined steroids 5. DM2 1. Will cont on SSI coverage 2. A1c 6.2 6. HTN 1. BP poorly controlled 2. Cont home meds for now with exception of ARB secondary to ARF 3. Cont PRN hydralazine for sbp>180 7. ARF 1. Avoid nephrotoxic agents 2. Renal function slightly worsen with diuresis, thus diuretics stopped and patient received gentle IVF 3. Renal function has improved 4. Plan to resume PO torsemide tomorrow 5. Cont to follow renal function closely 8. GERD 1. Stable 2. Cont PPI 9. DVT prophylaxis 1. Heparin subQ while admitted 10. UTI, possibly ESBL ecoli 1. Recent UTI with ESBL ecoli organism 2. Afebrile, pt with lower quadrant pain that has improved since starting abx 3. Discussed with ID. Given allergy hx, continue 5 days of Invanz then stop (1 more days) 11. Constipation 1. Large  stool burden seen on abd xray 2. Pt is complaining of lower quadrant pain 3. Will give trial of SMOG enema  Code Status: Full Family Communication: Pt in room, family over phone Disposition Plan: Home when stable, possibly on 7/25   Consultants:    Procedures:    Antibiotics:  Ertapenem 7/21>>>  HPI/Subjective: Complains of lower quadrant abd pain. States she feels constipated  Objective: Filed Vitals:   05/17/15 2112 05/18/15 0624 05/18/15 0804 05/18/15 1330  BP: 159/41 175/54 158/70 155/56  Pulse: 55 55  61  Temp: 97.9 F (36.6 C) 97.7 F (36.5 C)  97.6 F (36.4 C)  TempSrc: Oral Oral  Oral  Resp: 18 18  19   Height:      Weight:      SpO2: 99% 100%  100%    Intake/Output Summary (Last 24 hours) at 05/18/15 1603 Last data filed at 05/18/15 1350  Gross per 24 hour  Intake    995 ml  Output      0 ml  Net    995 ml   Filed Weights   05/15/15 1105 05/16/15 0554 05/17/15 0527  Weight: 80.151 kg (176 lb 11.2 oz) 80 kg (176 lb 5.9 oz) 78.9 kg (173 lb 15.1 oz)    Exam:   General:  Awake, laying in bed, appears in pain  Cardiovascular: regular, s1, s2  Respiratory: normal resp effort, no wheezing  Abdomen: soft, mildly distended, generally tender  Musculoskeletal: perfused, no clubbing,  Data Reviewed: Basic Metabolic Panel:  Recent Labs Lab 05/14/15 1454  05/15/15 0200 05/16/15 0417 05/17/15 0513 05/18/15 0520  NA 142 142 141 140 142  K 4.1 3.9 3.9 4.2 4.3  CL 105 106 103 102 105  CO2 GLUCOSE 164* 163* 126* 133* 113*  BUN 44* 43* 46* 52* 51*  CREATININE 1.90* 1.87* 1.89* 2.00* 1.78*  CALCIUM 8.8* 8.4* 8.5* 8.8* 8.9   Liver Function Tests:  Recent Labs Lab 05/15/15 0200  AST 13*  ALT 6*  ALKPHOS 43  BILITOT 0.4  PROT 6.0*  ALBUMIN 3.2*   No results for input(s): LIPASE, AMYLASE in the last 168 hours. No results for input(s): AMMONIA in the last 168 hours. CBC:  Recent Labs Lab 05/14/15 1454 05/15/15 0200  05/16/15 0417 05/17/15 0513  WBC 6.3 5.6 5.8 5.5  HGB 9.0* 8.6* 8.6* 9.4*  HCT 29.1* 27.7* 27.9* 30.0*  MCV 86.1 87.7 87.5 88.0  PLT 179 177 183 203   Cardiac Enzymes:  Recent Labs Lab 05/14/15 1454 05/14/15 2045 05/15/15 0200 05/15/15 0820  TROPONINI 0.03 0.03 0.03 0.04*   BNP (last 3 results)  Recent Labs  05/14/15 1454  BNP 1487.4*    ProBNP (last 3 results) No results for input(s): PROBNP in the last 8760 hours.  CBG:  Recent Labs Lab 05/17/15 1204 05/17/15 1720 05/17/15 2221 05/18/15 0809 05/18/15 1205  GLUCAP 152* 172* 122* 116* 115*    Recent Results (from the past 240 hour(s))  Urine culture     Status: None   Collection Time: 05/08/15  8:56 PM  Result Value Ref Range Status   Specimen Description URINE, CATHETERIZED  Final   Special Requests NONE  Final   Culture   Final    >=100,000 COLONIES/mL ESCHERICHIA COLI Confirmed Extended Spectrum Beta-Lactamase Producer (ESBL) Performed at [facility]    Report Status 05/11/2015 FINAL  Final   Organism ID, Bacteria ESCHERICHIA COLI  Final      Susceptibility   Escherichia coli - MIC*    AMPICILLIN >=32 RESISTANT Resistant     CEFAZOLIN >=64 RESISTANT Resistant     CEFTRIAXONE >=64 RESISTANT Resistant     CIPROFLOXACIN >=4 RESISTANT Resistant     GENTAMICIN >=16 RESISTANT Resistant     IMIPENEM <=0.25 SENSITIVE Sensitive     NITROFURANTOIN <=16 SENSITIVE Sensitive     TRIMETH/SULFA <=20 SENSITIVE Sensitive     AMPICILLIN/SULBACTAM >=32 RESISTANT Resistant     PIP/TAZO 8 SENSITIVE Sensitive     * >=100,000 COLONIES/mL ESCHERICHIA COLI  Urine culture     Status: None   Collection Time: 05/14/15  5:26 PM  Result Value Ref Range Status   Specimen Description URINE, CLEAN CATCH  Final   Special Requests NONE  Final   Culture   Final    MULTIPLE SPECIES PRESENT, SUGGEST RECOLLECTION IF CLINICALLY INDICATED Performed at [facility]    Report Status 05/15/2015 FINAL  Final      Studies: Dg Abd Portable 1v  05/18/2015   CLINICAL DATA:  Three day history of constipation.  EXAM: PORTABLE ABDOMEN - 1 VIEW  COMPARISON:  Acute abdomen series 04/27/1015. CT abdomen pelvis 04/22/2015.  FINDINGS: Bowel gas pattern unremarkable without evidence of obstruction or significant ileus. Large stool burden in the visualized colon. Aortoiliac atherosclerosis without aneurysm. Adjacent large phleboliths in the right ovarian vein projected next to the L4 vertebral body. Degenerative changes involve the lumbar spine.  IMPRESSION: No acute abdominal abnormality.  Large colonic stool burden.   Electronically Signed   By:  Hulan Saas M.D.   On: 05/18/2015 13:35    Scheduled Meds: . amLODipine  10 mg Oral Daily  . anastrozole  1 mg Oral Daily  . aspirin EC  81 mg Oral Daily  . cloNIDine  0.2 mg Oral BID  . ertapenem  500 mg Intravenous Q24H  . heparin  5,000 Units Subcutaneous 3 times per day  . hydrALAZINE  100 mg Oral TID  . insulin aspart  0-15 Units Subcutaneous TID WC  . insulin aspart  0-5 Units Subcutaneous QHS  . ipratropium  0.5 mg Nebulization Q6H  . levalbuterol  0.63 mg Nebulization Q6H  . pantoprazole  40 mg Oral Daily  . sodium chloride  3 mL Intravenous Q12H  . sorbitol, milk of mag, mineral oil, glycerin (SMOG) enema  960 mL Rectal Once  . [START ON 05/19/2015] torsemide  20 mg Oral Daily   Continuous Infusions:    Active Problems:   CHF (congestive heart failure)   Diabetes mellitus type 2 without retinopathy   HTN (hypertension)   COPD with acute exacerbation   ARF (acute renal failure)   Acute CHF   CHIU, STEPHEN K  [facility] Pager [phone]. If 7PM-7AM, please contact night-coverage at [email or web address], password [facility] 05/18/2015, 4:03 PM  LOS: 4 days

## 2015-05-18 NOTE — Progress Notes (Signed)
Pt refused her 08:00 neb. Pt stated she just woke up with and aggressive voice. When rt went back in 15 mins pt was eating. No distress noted at this time. Neb will be given at 14:00 unless pt calls.

## 2015-05-18 NOTE — Progress Notes (Signed)
Pt refused her 14:00 neb tx. Pt in no distress at this time,rn at bedside.

## 2015-05-19 ENCOUNTER — Inpatient Hospital Stay (HOSPITAL_COMMUNITY): Payer: Medicare Other

## 2015-05-19 LAB — BASIC METABOLIC PANEL
ANION GAP: 7 (ref 5–15)
BUN: 44 mg/dL — AB (ref 6–20)
CALCIUM: 8.7 mg/dL — AB (ref 8.9–10.3)
CO2: 28 mmol/L (ref 22–32)
Chloride: 105 mmol/L (ref 101–111)
Creatinine, Ser: 1.64 mg/dL — ABNORMAL HIGH (ref 0.44–1.00)
GFR calc Af Amer: 32 mL/min — ABNORMAL LOW (ref >60)
GFR, EST NON AFRICAN AMERICAN: 28 mL/min — AB (ref >60)
GLUCOSE: 104 mg/dL — AB (ref 65–99)
POTASSIUM: 3.7 mmol/L (ref 3.5–5.1)
Sodium: 140 mmol/L (ref 135–145)

## 2015-05-19 LAB — GLUCOSE, CAPILLARY
GLUCOSE-CAPILLARY: 145 mg/dL — AB (ref 65–99)
Glucose-Capillary: 110 mg/dL — ABNORMAL HIGH (ref 65–99)
Glucose-Capillary: 160 mg/dL — ABNORMAL HIGH (ref 65–99)
Glucose-Capillary: 171 mg/dL — ABNORMAL HIGH (ref 65–99)

## 2015-05-19 MED ORDER — PEG-KCL-NACL-NASULF-NA ASC-C 100 G PO SOLR: 1.0000 | Freq: Once | ORAL | Status: DC

## 2015-05-19 MED ORDER — SORBITOL 70 % SOLN
960.0000 mL | TOPICAL_OIL | Freq: Once | ORAL | Status: AC
  Administered 2015-05-19: 960 mL via RECTAL
  Filled 2015-05-19: qty 240

## 2015-05-19 MED ORDER — PEG-KCL-NACL-NASULF-NA ASC-C 100 G PO SOLR
0.5000 | Freq: Once | ORAL | Status: AC | PRN
  Filled 2015-05-19: qty 1

## 2015-05-19 MED ORDER — IOHEXOL 300 MG/ML  SOLN
50.0000 mL | INTRAMUSCULAR | Status: AC
  Administered 2015-05-19: 25 mL via ORAL

## 2015-05-19 MED ORDER — SORBITOL 70 % SOLN
960.0000 mL | TOPICAL_OIL | Freq: Two times a day (BID) | ORAL | Status: DC
  Administered 2015-05-20 – 2015-05-21 (×2): 960 mL via RECTAL
  Filled 2015-05-19 (×6): qty 240

## 2015-05-19 MED ORDER — SORBITOL 70 % SOLN: 30.0000 mL | Status: DC

## 2015-05-19 MED ORDER — PEG-KCL-NACL-NASULF-NA ASC-C 100 G PO SOLR
0.5000 | Freq: Once | ORAL | Status: AC
  Administered 2015-05-19: 100 g via ORAL
  Filled 2015-05-19: qty 1

## 2015-05-19 MED ORDER — LEVALBUTEROL HCL 0.63 MG/3ML IN NEBU: 0.6300 mg | INHALATION_SOLUTION | Freq: Four times a day (QID) | RESPIRATORY_TRACT | Status: DC | PRN

## 2015-05-19 MED ORDER — DICYCLOMINE HCL 20 MG PO TABS
20.0000 mg | ORAL_TABLET | Freq: Once | ORAL | Status: AC
  Administered 2015-05-19: 20 mg via ORAL
  Filled 2015-05-19: qty 1

## 2015-05-19 MED ORDER — SODIUM CHLORIDE 0.9 % IV SOLN
INTRAVENOUS | Status: DC
  Administered 2015-05-19 – 2015-05-20 (×2): via INTRAVENOUS

## 2015-05-19 MED ORDER — LEVALBUTEROL HCL 0.63 MG/3ML IN NEBU
0.6300 mg | INHALATION_SOLUTION | Freq: Three times a day (TID) | RESPIRATORY_TRACT | Status: DC
  Administered 2015-05-20 – 2015-05-22 (×5): 0.63 mg via RESPIRATORY_TRACT
  Filled 2015-05-19 (×8): qty 3

## 2015-05-19 MED ORDER — IPRATROPIUM BROMIDE 0.02 % IN SOLN
0.5000 mg | Freq: Three times a day (TID) | RESPIRATORY_TRACT | Status: DC
  Administered 2015-05-20 – 2015-05-22 (×5): 0.5 mg via RESPIRATORY_TRACT
  Filled 2015-05-19 (×8): qty 2.5

## 2015-05-19 NOTE — Progress Notes (Signed)
Soap suds enema ordered by Dr. Rhona Leavens.  Writer and patient's nurse tech, Ashok Cordia, went into room to explain soap suds enema.  Patient agreed to have soap suds enema.  Patient could only tolerate 3 inches of tube inserted into rectum.  Patient yelled " its hurting, stop, get it out."  Writer immediately pulled out enema and emotionally comforted patient.  Dr. Rhona Leavens verbally informed patient could not tolerate enema and refusing.  Dr. Rhona Leavens informed patient's daughter, Malachi Bonds.

## 2015-05-19 NOTE — Progress Notes (Signed)
Occupational Therapy Treatment Patient Details Name: Leslie Parrish MRN: [id number] DOB: April 08, 1928 Today's Date: 05/19/2015       OT comments  Pt VERY rude to staff (OT and RN)regarding transfer from toilet and back to bed.  Pt speaks to staff VERY disrepectfullly!  Follow Up Recommendations  Supervision/Assistance - 24 hour    Equipment Recommendations  None recommended by OT    Recommendations for Other Services      Precautions / Restrictions Precautions Precautions: Fall Precaution Comments: prefers to be lifted under armpits. Pt/family very particular about how she is assisted Restrictions Weight Bearing Restrictions: No       Mobility Bed Mobility Overal bed mobility: Needs Assistance;+2 for physical assistance;+ 2 for safety/equipment Bed Mobility: Sit to Supine       Sit to supine: Total assist      Transfers Overall transfer level: Needs assistance Equipment used: 2 person hand held assist Transfers: Sit to/from [employer] Transfers Sit to Stand: +2 physical assistance;Total assist Stand pivot transfers: +2 physical assistance;Total assist                ADL Overall ADL's : Needs assistance/impaired                         Toilet Transfer: +2 for physical assistance;Maximal assistance;Stand-pivot;Cueing for safety   Toileting- Clothing Manipulation and Hygiene: Total assistance;Sit to/from stand;+2 for safety/equipment;+2 for physical assistance         General ADL Comments: pt VERY hateful!                  Cognition   Behavior During Therapy: Agitated Overall Cognitive Status: Within Functional Limits for tasks assessed                               General Comments      Pertinent Vitals/ Pain       Faces Pain Scale: Hurts whole lot Pain Location: abdomen Pain Descriptors / Indicators: Sore Pain Intervention(s): Limited activity within patient's tolerance;Monitored during session;Repositioned      Prior Functioning/Environment              Frequency Min 2X/week     Progress Toward Goals  OT Goals(current goals can now be found in the care plan section)  Progress towards OT goals: Not progressing toward goals - comment (pt not participative)     Plan            Activity Tolerance Treatment limited secondary to agitation   Patient Left in bed;with call bell/phone within reach;with bed alarm set   Nurse Communication          Time: 4098-1191 OT Time Calculation (min): 11 min  Charges: OT General Charges $OT Visit: 1 Procedure OT Treatments $Self Care/Home Management : 8-22 mins  [name], [name] D 05/19/2015, 1:33 PM

## 2015-05-19 NOTE — Progress Notes (Signed)
PT Cancellation Note  Patient Details Name: Leslie Parrish MRN: [id number] DOB: 03-27-1928   Cancelled Treatment:    Reason Eval/Treat Not Completed: Attempted PT tx session-pt declined to participate at this time. Will check back as schedule permits. Thanks.    Rebeca Alert, MPT Pager: [phone]

## 2015-05-19 NOTE — Progress Notes (Signed)
Patient wants to get into the chair.  Teacher, English as a foreign language, Leslie Parrish, assist patient into chair with chair alarm in use.

## 2015-05-19 NOTE — Progress Notes (Signed)
PT Cancellation Note  Patient Details Name: Leslie Parrish MRN: [id number] DOB: Jul 17, 1928   Cancelled Treatment:    Reason Eval/Treat Not Completed: 2nd attempt for PT tx session. Pt asleep in chair. did not awaken since pt has been complaining of not getting any rest. Will check back if schedule permits. Thanks. Per RN, pt has transferred, bed<>recliner, several times today.    Rebeca Alert, MPT Pager: [phone]

## 2015-05-19 NOTE — Progress Notes (Signed)
Visited with patient every hour X3 before she is finally compliant to BD via nebulizer. Daughter in room states her mother is only supposed to take her breathing medicine 3 times daily at home, but is mostly noncompliant with home regimen as well as during this admission. On the other hand, the daughter does admit that when the patient is having difficulty breathing (such as a recent bout of pneumonia) she is compliant with nebulizer treatments. RT protocol assessment completed. Orders changed according to acuity score and home regimen per RT protocol.

## 2015-05-19 NOTE — Progress Notes (Signed)
Patient refusing to have vital signs taken. Will continue to monitor patient.

## 2015-05-19 NOTE — Treatment Plan (Signed)
Reviewed results of CT scan with pt and daughter, Malachi Bonds at bedside. Pt has significant stool burden. She has been unable to tolerate PO bowel prep and has been resistant to enemas. Ultimately with assistance from daughter, pt is reluctantly agreeable to trial of soap suds enema. Pt unable to hold soap suds enema. Agreeable to [facility] as it yielded some result yesterday. Pt noted to be nauseated when attempting PO. Will make NPO and continue with scheduled SMOG enema until results. If no significant results, consider GI consult in AM.  Family at bedside and is in agreement to plan of care. All questions answered.

## 2015-05-19 NOTE — Progress Notes (Signed)
Pharmacist Heart Failure Core Measure Documentation  Assessment: Leslie Parrish has an EF documented as 30-25% on 05/16/15 by ECHO.  Rationale: Heart failure patients with left ventricular systolic dysfunction (LVSD) and an EF < 40% should be prescribed an angiotensin converting enzyme inhibitor (ACEI) or angiotensin receptor blocker (ARB) at discharge unless a contraindication is documented in the medical record.  This patient is not currently on an ACEI or ARB for HF.  This note is being placed in the record in order to provide documentation that a contraindication to the use of these agents is present for this encounter.  ACE Inhibitor or Angiotensin Receptor Blocker is contraindicated (specify all that apply)    ACEI allergy AND ARB allergy   Angioedema   Moderate or severe aortic stenosis   Hyperkalemia   Hypotension   Renal artery stenosis   Worsening renal function, preexisting renal disease or dysfunction   Dannielle Huh 05/19/2015 9:43 AM

## 2015-05-19 NOTE — Progress Notes (Signed)
[facility] PROGRESS NOTE  Letetia Romanello WUJ:[id number] DOB: [birth year] DOA: 05/14/2015 PCP: No primary care provider on file.  Assessment/Plan: 1. SOB suspect secondary to acute CHF with copd exacerbation 1. CXR noted with cardiomegaly with small effusions, otherwise clear  2. Pt on baseline 2.5L [place] prior to admit 3. Elevated BNP on admit in the setting of documented chf hx and presenting sob 4. Pt is cont on nebs as scheduled 5. On min O2 support 2. Suspected acute systolic/diastolic CHF 1. Elevated BNP this admission in the setting of increased sob 2. Patient was continued on scheduled IV lasix with good urine output. 3. Transitioned to PO lasix 4. Will continue daily weights dry wt seems to be around 79-80kg 5. 2d echo with EF 30-35% with grade 2 diastolic dysfunction which seems to be pt's baseline EF 6. TSH was normal  3. Hx CAD s/p CABG in 2012 1. Pt was continued chest pain, nausea, diaphoresis 2. Trop serially negative 3. 2d echo without WMA 4. Cont ASA 4. Possible mild COPD exacerbation 1. Will cont on duonebs PRN 2. No wheezing 3. Pt had declined steroids 5. DM2 1. Will cont on SSI coverage 2. A1c 6.2 6. HTN 1. BP poorly controlled 2. Cont home meds for now with exception of ARB secondary to ARF 3. Cont PRN hydralazine for sbp>180 7. ARF 1. Avoid nephrotoxic agents 2. Renal function slightly worsen with diuresis, thus diuretics stopped and patient received gentle IVF 3. Renal function has improved 4. Plan to resume PO torsemide tomorrow 5. Cont to follow renal function closely 8. GERD 1. Stable 2. Cont PPI 9. DVT prophylaxis 1. Heparin subQ while admitted 10. UTI, possibly ESBL ecoli 1. Recent UTI with ESBL ecoli organism 2. Afebrile, pt with lower quadrant pain that has improved since starting abx 3. Discussed with ID. Given allergy hx, continue 5 days of Invanz then stop (complete after today's dose) 11. Abd pain likely secondary to  constipation 1. Large stool burden seen on abd xray 2. Pt is complaining of lower quadrant pain 3. Minimal results with SMOG enema 4. Pt is refusing bowel prep, enema 5. CT abd/pelvis ordered 6. Discussed with daughter Malachi Bonds) this AM that pt is refusing treatments. Daughter ultimately arrived later in the afternoon  Code Status: Full Family Communication: Pt in room, family over phone Disposition Plan: Home when stable   Consultants:    Procedures:    Antibiotics:  Ertapenem 7/21>>>7/25  HPI/Subjective: Continues to have marked generalized abd pain. Small hard stool noted overnight  Objective: Filed Vitals:   05/19/15 0430 05/19/15 0432 05/19/15 0506 05/19/15 1419  BP: 160/45   155/70  Pulse: 57   65  Temp: 98.2 F (36.8 C)   98 F (36.7 C)  TempSrc: Oral   Oral  Resp: 18   18  Height:      Weight:  78 kg (171 lb 15.3 oz)    SpO2: 97%  96% 99%    Intake/Output Summary (Last 24 hours) at 05/19/15 1607 Last data filed at 05/19/15 0745  Gross per 24 hour  Intake    120 ml  Output      0 ml  Net    120 ml   Filed Weights   05/16/15 0554 05/17/15 0527 05/19/15 0432  Weight: 80 kg (176 lb 5.9 oz) 78.9 kg (173 lb 15.1 oz) 78 kg (171 lb 15.3 oz)    Exam:   General:  Awake,sitting in chair, appears to be in pain  Cardiovascular:  regular, s1, s2  Respiratory: normal resp effort, no wheezing  Abdomen: soft, mildly distended, generally tender  Musculoskeletal: perfused, no clubbing, no cyanosis  Data Reviewed: Basic Metabolic Panel:  Recent Labs Lab 05/15/15 0200 05/16/15 0417 05/17/15 0513 05/18/15 0520 05/19/15 0549  NA 142 141 140 142 140  K 3.9 3.9 4.2 4.3 3.7  CL 106 103 102 105 105  CO2 30 30 29 27 28   GLUCOSE 163* 126* 133* 113* 104*  BUN 43* 46* 52* 51* 44*  CREATININE 1.87* 1.89* 2.00* 1.78* 1.64*  CALCIUM 8.4* 8.5* 8.8* 8.9 8.7*   Liver Function Tests:  Recent Labs Lab 05/15/15 0200  AST 13*  ALT 6*  ALKPHOS 43  BILITOT  0.4  PROT 6.0*  ALBUMIN 3.2*   No results for input(s): LIPASE, AMYLASE in the last 168 hours. No results for input(s): AMMONIA in the last 168 hours. CBC:  Recent Labs Lab 05/14/15 1454 05/15/15 0200 05/16/15 0417 05/17/15 0513  WBC 6.3 5.6 5.8 5.5  HGB 9.0* 8.6* 8.6* 9.4*  HCT 29.1* 27.7* 27.9* 30.0*  MCV 86.1 87.7 87.5 88.0  PLT 179 177 183 203   Cardiac Enzymes:  Recent Labs Lab 05/14/15 1454 05/14/15 2045 05/15/15 0200 05/15/15 0820  TROPONINI 0.03 0.03 0.03 0.04*   BNP (last 3 results)  Recent Labs  05/14/15 1454  BNP 1487.4*    ProBNP (last 3 results) No results for input(s): PROBNP in the last 8760 hours.  CBG:  Recent Labs Lab 05/18/15 1205 05/18/15 1653 05/18/15 2138 05/19/15 0738 05/19/15 1222  GLUCAP 115* 120* 123* 110* 160*    Recent Results (from the past 240 hour(s))  Urine culture     Status: None   Collection Time: 05/14/15  5:26 PM  Result Value Ref Range Status   Specimen Description URINE, CLEAN CATCH  Final   Special Requests NONE  Final   Culture   Final    MULTIPLE SPECIES PRESENT, SUGGEST RECOLLECTION IF CLINICALLY INDICATED Performed at [facility]    Report Status 05/15/2015 FINAL  Final     Studies: Dg Abd Portable 1v  05/18/2015   CLINICAL DATA:  Three day history of constipation.  EXAM: PORTABLE ABDOMEN - 1 VIEW  COMPARISON:  Acute abdomen series 04/27/1015. CT abdomen pelvis 04/22/2015.  FINDINGS: Bowel gas pattern unremarkable without evidence of obstruction or significant ileus. Large stool burden in the visualized colon. Aortoiliac atherosclerosis without aneurysm. Adjacent large phleboliths in the right ovarian vein projected next to the L4 vertebral body. Degenerative changes involve the lumbar spine.  IMPRESSION: No acute abdominal abnormality.  Large colonic stool burden.   Electronically Signed   By: Hulan Saas M.D.   On: 05/18/2015 13:35    Scheduled Meds: . amLODipine  10 mg Oral Daily  .  anastrozole  1 mg Oral Daily  . aspirin EC  81 mg Oral Daily  . cloNIDine  0.2 mg Oral BID  . ertapenem  500 mg Intravenous Q24H  . heparin  5,000 Units Subcutaneous 3 times per day  . hydrALAZINE  100 mg Oral TID  . insulin aspart  0-15 Units Subcutaneous TID WC  . insulin aspart  0-5 Units Subcutaneous QHS  . iohexol  50 mL Oral Q1 Hr x 2  . ipratropium  0.5 mg Nebulization Q6H  . levalbuterol  0.63 mg Nebulization Q6H  . pantoprazole  40 mg Oral Daily  . sodium chloride  3 mL Intravenous Q12H  . torsemide  20 mg Oral Daily  Continuous Infusions:    Active Problems:   CHF (congestive heart failure)   Diabetes mellitus type 2 without retinopathy   HTN (hypertension)   COPD with acute exacerbation   ARF (acute renal failure)   Acute CHF   Moiz Ryant K  [facility] Pager [phone]. If 7PM-7AM, please contact night-coverage at [email or web address], password [facility] 05/19/2015, 4:07 PM  LOS: 5 days

## 2015-05-19 NOTE — Care Management Important Message (Signed)
Important Message  Patient Details  Name: Lezli Danek MRN: [id number] Date of Birth: 15-Nov-1927   Medicare Important Message Given:  Yes-third notification given    Haskell Flirt 05/19/2015, 1:22 PMImportant Message  Patient Details  Name: Alyissa Whidbee MRN: [id number] Date of Birth: 03-19-1928   Medicare Important Message Given:  Yes-third notification given    Haskell Flirt 05/19/2015, 1:22 PM

## 2015-05-19 NOTE — Progress Notes (Signed)
Patient refusing to stand up so bed can be zeroed out to set bed alarm.  Patient states " I am not standing up and you can't make me."  Will continue to monitor patient.

## 2015-05-19 NOTE — Plan of Care (Signed)
Problem: Phase II Progression Outcomes Goal: Begin discharge teaching Outcome: Progressing Pending Discharge status, and self care regarding bowel regimen.

## 2015-05-19 NOTE — Progress Notes (Signed)
Patient says " I am feeling sick, and I am going to throw up."  Patient vomited an estimated 20 cc of yellow emesis.  Patient still refusing to have vital signs taken.  Dr. Rhona Leavens paged.  New orders received for CT scan of abdomen.  Will continue to monitor patient.

## 2015-05-20 ENCOUNTER — Encounter (HOSPITAL_COMMUNITY): Payer: Self-pay | Admitting: Gastroenterology

## 2015-05-20 DIAGNOSIS — I5021 Acute systolic (congestive) heart failure: Secondary | ICD-10-CM

## 2015-05-20 LAB — CBC
HCT: 30.7 % — ABNORMAL LOW (ref 36.0–46.0)
HEMOGLOBIN: 9.5 g/dL — AB (ref 12.0–15.0)
MCH: 27.2 pg (ref 26.0–34.0)
MCHC: 30.9 g/dL (ref 30.0–36.0)
MCV: 88 fL (ref 78.0–100.0)
PLATELETS: 218 10*3/uL (ref 150–400)
RBC: 3.49 MIL/uL — ABNORMAL LOW (ref 3.87–5.11)
RDW: 15.8 % — AB (ref 11.5–15.5)
WBC: 5.9 10*3/uL (ref 4.0–10.5)

## 2015-05-20 LAB — BASIC METABOLIC PANEL
ANION GAP: 9 (ref 5–15)
BUN: 49 mg/dL — ABNORMAL HIGH (ref 6–20)
CO2: 27 mmol/L (ref 22–32)
Calcium: 9 mg/dL (ref 8.9–10.3)
Chloride: 107 mmol/L (ref 101–111)
Creatinine, Ser: 1.72 mg/dL — ABNORMAL HIGH (ref 0.44–1.00)
GFR calc Af Amer: 30 mL/min — ABNORMAL LOW (ref >60)
GFR calc non Af Amer: 26 mL/min — ABNORMAL LOW (ref >60)
GLUCOSE: 131 mg/dL — AB (ref 65–99)
Potassium: 3.9 mmol/L (ref 3.5–5.1)
Sodium: 143 mmol/L (ref 135–145)

## 2015-05-20 LAB — GLUCOSE, CAPILLARY
GLUCOSE-CAPILLARY: 109 mg/dL — AB (ref 65–99)
Glucose-Capillary: 141 mg/dL — ABNORMAL HIGH (ref 65–99)
Glucose-Capillary: 99 mg/dL (ref 65–99)

## 2015-05-20 MED ORDER — BISACODYL 10 MG RE SUPP
10.0000 mg | Freq: Once | RECTAL | Status: AC
  Administered 2015-05-20: 10 mg via RECTAL
  Filled 2015-05-20: qty 1

## 2015-05-20 MED ORDER — POLYETHYLENE GLYCOL 3350 17 G PO PACK
17.0000 g | PACK | Freq: Four times a day (QID) | ORAL | Status: DC
Start: 2015-05-20 — End: 2015-05-21
  Administered 2015-05-20 – 2015-05-21 (×5): 17 g via ORAL
  Filled 2015-05-20 (×6): qty 1

## 2015-05-20 MED ORDER — CLONIDINE HCL 0.1 MG PO TABS
0.1000 mg | ORAL_TABLET | Freq: Two times a day (BID) | ORAL | Status: DC
  Administered 2015-05-20 – 2015-05-22 (×4): 0.1 mg via ORAL
  Filled 2015-05-20 (×4): qty 1

## 2015-05-20 NOTE — Progress Notes (Signed)
OOB  To BSC no results back to bed.

## 2015-05-20 NOTE — Progress Notes (Signed)
RT came in the morning and afternoon to give breathing treatments (atrovent/xopenex) and patient refused both times.

## 2015-05-20 NOTE — Progress Notes (Signed)
OOB chair taking lunch

## 2015-05-20 NOTE — Progress Notes (Addendum)
[facility] PROGRESS NOTE  Leslie Parrish ZOX:[id number] DOB: Mar 14, 1928 DOA: 05/14/2015 PCP: No primary care provider on file.   Off Service Summary: [id number] with hx of combined systolic/diastolic CHF initially presented with abd pain and findings worrisome for ESBL uti. The patient was continued on IV abx per ID recs. During this course, the patient reported lower abd pain. Imaging confirmed significant stool burden. Unfortunately, the patient would not tolerate PO cathartics as well as enemas and GI consulted. At present, the patient is continued on scheduled miralax and scheduled SMOG enemas per GI recs, awaiting results.  Assessment/Plan: 1. SOB suspect secondary to acute CHF with copd exacerbation 1. CXR noted with cardiomegaly with small effusions, otherwise clear  2. Pt on baseline 2.5L [place] prior to admit 3. Elevated BNP on admit in the setting of documented chf hx and presenting sob 4. Pt is cont on nebs as scheduled 5. On min O2 support 2. Suspected acute systolic/diastolic CHF 1. Elevated BNP this admission in the setting of increased sob 2. Patient was continued on scheduled IV lasix with good urine output. 3. Holding lasix per below 4. Will continue daily weights dry wt seems to be around 79-80kg 5. 2d echo with EF 30-35% with grade 2 diastolic dysfunction which seems to be pt's baseline EF 6. TSH was normal  3. Hx CAD s/p CABG in 2012 1. Pt was continued chest pain, nausea, diaphoresis 2. Trop serially negative 3. 2d echo without WMA 4. Cont ASA 4. Possible mild COPD exacerbation 1. Will cont on duonebs PRN 2. No wheezing 3. Pt had declined steroids 5. DM2 1. Will cont on SSI coverage 2. A1c 6.2 6. HTN 1. BP poorly controlled 2. Cont home meds for now with exception of ARB secondary to ARF 3. Cont PRN hydralazine for sbp>180 7. ARF 1. Avoid nephrotoxic agents 2. Given symptomatic constipation, pt only taking minimal amounts of PO 3. Diuretics currently stopped  and patient received gentle IVF 4. Renal function has improved since admit 5. Cont to follow renal function closely 8. GERD 1. Stable 2. Cont PPI 9. DVT prophylaxis 1. Heparin subQ while admitted 10. UTI, possibly ESBL ecoli 1. Recent UTI with ESBL ecoli organism 2. Afebrile, pt cont with lower quadrant pain 3. Discussed with ID. Given pcn allergy hx, completed 5 days of Invanz 11. Abd pain likely secondary to constipation 1. Large stool burden seen on abd xray, increased stool amount on follow up CT abd associated with lower quadrant pain 2. Minimal results with SMOG enema and pt has been refusing bowel prep recently 3. Small hard stool noted overnight with SMOG. 4. Have consulted GI with assistance. Appreciate recs. Plans to cont scheduled enemas with concurrent miralax  Code Status: Full Family Communication: Pt in room Disposition Plan: Home when stable   Consultants:    Procedures:    Antibiotics:  Ertapenem 7/21>>>7/25  HPI/Subjective: Complains of lower abdominal pain. Yells, "mommy" while in pain  Objective: Filed Vitals:   05/19/15 1419 05/19/15 2043 05/20/15 0445 05/20/15 0500  BP: 155/70 166/59 159/41   Pulse: 65 63 52   Temp: 98 F (36.7 C) 99 F (37.2 C) 97.7 F (36.5 C)   TempSrc: Oral Oral Oral   Resp: 18 18 16    Height:      Weight:    78 kg (171 lb 15.3 oz)  SpO2: 99% 100% 93%     Intake/Output Summary (Last 24 hours) at 05/20/15 1325 Last data filed at 05/20/15 1227  Gross per  24 hour  Intake    880 ml  Output      0 ml  Net    880 ml   Filed Weights   05/17/15 0527 05/19/15 0432 05/20/15 0500  Weight: 78.9 kg (173 lb 15.1 oz) 78 kg (171 lb 15.3 oz) 78 kg (171 lb 15.3 oz)    Exam:   General:  Awake, laying on side in bed, appears to be uncomfortable  Cardiovascular: regular, s1, s2  Respiratory: normal resp effort, no wheezing  Abdomen: soft, mildly distended, generally tender, trace bowel sounds  Musculoskeletal:  perfused, no clubbing, no cyanosis  Data Reviewed: Basic Metabolic Panel:  Recent Labs Lab 05/16/15 0417 05/17/15 0513 05/18/15 0520 05/19/15 0549 05/20/15 0459  NA 141 140 142 140 143  K 3.9 4.2 4.3 3.7 3.9  CL 103 102 105 105 107  CO2 30 29 27 28 27   GLUCOSE 126* 133* 113* 104* 131*  BUN 46* 52* 51* 44* 49*  CREATININE 1.89* 2.00* 1.78* 1.64* 1.72*  CALCIUM 8.5* 8.8* 8.9 8.7* 9.0   Liver Function Tests:  Recent Labs Lab 05/15/15 0200  AST 13*  ALT 6*  ALKPHOS 43  BILITOT 0.4  PROT 6.0*  ALBUMIN 3.2*   No results for input(s): LIPASE, AMYLASE in the last 168 hours. No results for input(s): AMMONIA in the last 168 hours. CBC:  Recent Labs Lab 05/14/15 1454 05/15/15 0200 05/16/15 0417 05/17/15 0513 05/20/15 0459  WBC 6.3 5.6 5.8 5.5 5.9  HGB 9.0* 8.6* 8.6* 9.4* 9.5*  HCT 29.1* 27.7* 27.9* 30.0* 30.7*  MCV 86.1 87.7 87.5 88.0 88.0  PLT 179 177 183 203 218   Cardiac Enzymes:  Recent Labs Lab 05/14/15 1454 05/14/15 2045 05/15/15 0200 05/15/15 0820  TROPONINI 0.03 0.03 0.03 0.04*   BNP (last 3 results)  Recent Labs  05/14/15 1454  BNP 1487.4*    ProBNP (last 3 results) No results for input(s): PROBNP in the last 8760 hours.  CBG:  Recent Labs Lab 05/19/15 1222 05/19/15 1630 05/19/15 2223 05/20/15 0800 05/20/15 1218  GLUCAP 160* 171* 145* 109* 141*    Recent Results (from the past 240 hour(s))  Urine culture     Status: None   Collection Time: 05/14/15  5:26 PM  Result Value Ref Range Status   Specimen Description URINE, CLEAN CATCH  Final   Special Requests NONE  Final   Culture   Final    MULTIPLE SPECIES PRESENT, SUGGEST RECOLLECTION IF CLINICALLY INDICATED Performed at [facility]    Report Status 05/15/2015 FINAL  Final     Studies: Ct Abdomen Pelvis Wo Contrast  05/19/2015   CLINICAL DATA:  Initial encounter for lower abdominal pain bilaterally  EXAM: CT ABDOMEN AND PELVIS WITHOUT CONTRAST  TECHNIQUE:  Multidetector CT imaging of the abdomen and pelvis was performed following the standard protocol without IV contrast.  COMPARISON:  04/22/2015.  FINDINGS: Lower chest: Small to moderate right pleural effusion is slightly progressive in the interval. There is adjacent right lower lobe collapse/ consolidation. Some minimal compressive probable atelectasis is seen dependently in the left lower lobe.  Hepatobiliary: No focal abnormality in the liver on this study without intravenous contrast. No evidence for hepatomegaly. Gallbladder is contracted with tiny calcified stones evident. No intrahepatic or extrahepatic biliary dilation.  Pancreas: No focal mass lesion. No dilatation of the main duct. No intraparenchymal cyst. No peripancreatic edema.  Spleen: No splenomegaly. No focal mass lesion.  Adrenals/Urinary Tract: No adrenal nodule or mass. 19  mm low-density lesion in the interpolar right kidney is stable and likely represents a cyst. 15 mm hyper attenuating lesion is identified in the interpolar left kidney, stable. No evidence for hydroureter. Urinary bladder is unremarkable.  Stomach/Bowel: Stomach is nondistended. No gastric wall thickening. No evidence of outlet obstruction. Duodenum is normally positioned as is the ligament of Treitz. No small bowel wall thickening. No small bowel dilatation. Prominent stool volume noted throughout the colon up to the level of the sigmoid segment with no substantial stool from the mid sigmoid colon to the rectum. Although somewhat difficult to assess without intravenous contrast material, there is no evidence for colonic wall thickening.  Vascular/Lymphatic: There is abdominal aortic atherosclerosis without aneurysm. No lymphadenopathy in the abdomen or pelvis.  Reproductive: Uterus is surgically absent.  No adnexal mass.  Other: No intraperitoneal free fluid.  Musculoskeletal: There are 2 right para midline ventral hernias in the region of the umbilicus. Both contain only  omental fat. The more cranial of the 2 which is just cranial to the umbilicus has some edema/fluid within the hernia sac as before a degree of fatty incarceration could have this appearance. There is no evidence for bowel loop in either hernia sac. Bones are diffusely demineralized without worrisome lytic or sclerotic osseous abnormality. Degenerative changes are seen in the lumbar spine, most advanced at L3-4 and L4-5.  IMPRESSION: 1. Interval increase in colonic stool volume. Otherwise stable exam. 2. Stable appearance of the supraumbilical fat containing hernia with associated fluid/edema. Again, this raises the question of fatty incarceration. A second right para midline ventral hernia is also evident. There is no evidence for bowel loops in either hernia sac. 3. Stable appearance of the hyperdense left renal lesion, likely a complex cyst although neoplasm could have this appearance. 4. The thickening of the inferior left rectus muscle described previously is less evident on today's study. 5. Abdominal aortic atherosclerosis. 6. Slight increase in the small to moderate left pleural effusion. 7. Cholelithiasis.   Electronically Signed   By: Kennith Center M.D.   On: 05/19/2015 17:39    Scheduled Meds: . amLODipine  10 mg Oral Daily  . anastrozole  1 mg Oral Daily  . aspirin EC  81 mg Oral Daily  . cloNIDine  0.1 mg Oral BID  . heparin  5,000 Units Subcutaneous 3 times per day  . hydrALAZINE  100 mg Oral TID  . insulin aspart  0-15 Units Subcutaneous TID WC  . insulin aspart  0-5 Units Subcutaneous QHS  . ipratropium  0.5 mg Nebulization TID  . levalbuterol  0.63 mg Nebulization TID  . pantoprazole  40 mg Oral Daily  . polyethylene glycol  17 g Oral QID  . sodium chloride  3 mL Intravenous Q12H  . sorbitol, milk of mag, mineral oil, glycerin (SMOG) enema  960 mL Rectal BID   Continuous Infusions: . sodium chloride 75 mL/hr at 05/20/15 1232    Active Problems:   CHF (congestive heart  failure)   Diabetes mellitus type 2 without retinopathy   HTN (hypertension)   COPD with acute exacerbation   ARF (acute renal failure)   Acute CHF   Arie Powell K  [facility] Pager [phone]. If 7PM-7AM, please contact night-coverage at [email or web address], password [facility] 05/20/2015, 1:25 PM  LOS: 6 days

## 2015-05-20 NOTE — Consult Note (Signed)
[facility] CONSULT Reason for consult: abdominal pain and chronic constipation Referring Physician: [facility]. PCP: per patient none in [place]. Primary G.I.: none patient unassigned  Leslie Parrish is an 81 y.o. female.  HPI: she and her daughter recently moved here from [place]. She is unable to tell me what city in [place] that they lived in or why they moved here. She states that she has been chronically constipated and had abdominal pain " for years". She denies ever having a colonoscopy and states that she will never have. She tells me that she often goes 5 to 6 days without bowel movements and that "nothing works for me". She was admitted to the hospital with severe shortness of breath felt to be due to congestive heart failure and COPD exacerbation. She apparently is on chronic O2 at home. Review of the records indicates multiple emergency room visits in the past month related primarily to shortness of breath. She reports no bowel movement since coming into the hospital and that she is having severe abdominal pain and request pain medications. 2 days ago, KUB showed increased stool throughout the entire colon. CT scan was repeated and compared to previous CT scan showing some intra-abdominal herniations containing only adipose tissue without clear bowel or bowel obstruction. Throughout the: up to the sigmoid colon. The patient has been receiving enemas for the past several days. There was no clear thickening of the sigmoid colon. Denies ever having had diverticulitis. The exam was done without intravenous contrast. She reports that she is continuing to have pain and it is been worse the past several days despite the enemas. She request pain medications. Labs reveal potassium 3.9 in WBC 5.9 with hemoglobin 9.5. Patient unable to tell me previous surgeries but the records indicate that she has had previous appendectomy and abdominal hysterectomy. She also has history of COPD on  chronic oxygen at home. She denies smoking. She has history of CHF and has had previous cardiac evaluation results unknown. She is diabetic. She denies family history of colon cancer are polyps. She chronically is on oxycodone and Percocet for unclear reasons her narcotics have been stopped in the hospital. She has been on sorbitol milk of magnesia enemas without much results.  Past Medical History  Diagnosis Date  . Hypertension   . CHF (congestive heart failure)   . GERD (gastroesophageal reflux disease)   . COPD (chronic obstructive pulmonary disease)   . Asthma   . Diabetes mellitus without complication   . UTI (lower urinary tract infection)     Past Surgical History  Procedure Laterality Date  . Abdominal hysterectomy    . Appendectomy      Family History  Problem Relation Age of Onset  . Family history unknown: Yes    Social History:  reports that she has never smoked. She does not have any smokeless tobacco history on file. She reports that she does not drink alcohol or use illicit drugs.  Allergies:  Allergies  Allergen Reactions  . Albuterol Sulfate     Per Md- does not want her taking because it raises her blood pressure.   Marland Kitchen Penicillins Swelling  . Sulfa Antibiotics Swelling  . Compazine [Prochlorperazine Edisylate] Palpitations    Medications; Prior to Admission medications   Medication Sig Start Date End Date Taking? Authorizing Provider  amLODipine (NORVASC) 10 MG tablet Take 10 mg by mouth daily.   Yes Historical Provider, MD  anastrozole (ARIMIDEX) 1 MG tablet Take 1 mg by mouth daily.  Yes Historical Provider, MD  aspirin EC 81 MG tablet Take 81 mg by mouth daily.   Yes Historical Provider, MD  candesartan (ATACAND) 32 MG tablet Take 32 mg by mouth daily.   Yes Historical Provider, MD  Cholecalciferol (VITAMIN D-3 PO) Take 1 tablet by mouth daily.   Yes Historical Provider, MD  cloNIDine (CATAPRES) 0.2 MG tablet Take 0.2 mg by mouth 2 (two) times daily.    Yes Historical Provider, MD  dicyclomine (BENTYL) 20 MG tablet Take 20 mg by mouth daily as needed for spasms.   Yes Historical Provider, MD  hydrALAZINE (APRESOLINE) 100 MG tablet Take 100 mg by mouth 3 (three) times daily.   Yes Historical Provider, MD  ketoconazole (NIZORAL) 2 % cream Apply 1 application topically at bedtime. 12/21/14  Yes Historical Provider, MD  levalbuterol Penne Lash) 1.25 MG/3ML nebulizer solution Inhale 1 ampule into the lungs 4 (four) times daily. 12/23/14  Yes Historical Provider, MD  ondansetron (ZOFRAN ODT) 4 MG disintegrating tablet Take 1 tablet (4 mg total) by mouth every 8 (eight) hours as needed for nausea or vomiting. 04/27/15  Yes Alfonzo Beers, MD  oxyCODONE (OXY IR/ROXICODONE) 5 MG immediate release tablet Take 0.5 tablets by mouth 2 (two) times daily as needed. pain 04/13/15  Yes Historical Provider, MD  oxyCODONE-acetaminophen (PERCOCET/ROXICET) 5-325 MG per tablet Take 1-2 tablets by mouth every 6 (six) hours as needed for severe pain. 04/27/15  Yes Alfonzo Beers, MD  pantoprazole (PROTONIX) 40 MG tablet Take 40 mg by mouth daily.   Yes Historical Provider, MD  potassium chloride SA (K-DUR,KLOR-CON) 20 MEQ tablet Take 20 mEq by mouth daily.   Yes Historical Provider, MD  torsemide (DEMADEX) 20 MG tablet Take 20 mg by mouth daily.   Yes Historical Provider, MD  cephALEXin (KEFLEX) 500 MG capsule Take 1 capsule (500 mg total) by mouth 3 (three) times daily. Patient not taking: Reported on 05/14/2015 05/08/15   Jola Schmidt, MD   . amLODipine  10 mg Oral Daily  . anastrozole  1 mg Oral Daily  . aspirin EC  81 mg Oral Daily  . cloNIDine  0.2 mg Oral BID  . ertapenem  500 mg Intravenous Q24H  . heparin  5,000 Units Subcutaneous 3 times per day  . hydrALAZINE  100 mg Oral TID  . insulin aspart  0-15 Units Subcutaneous TID WC  . insulin aspart  0-5 Units Subcutaneous QHS  . ipratropium  0.5 mg Nebulization TID  . levalbuterol  0.63 mg Nebulization TID  . pantoprazole   40 mg Oral Daily  . sodium chloride  3 mL Intravenous Q12H  . sorbitol, milk of mag, mineral oil, glycerin (SMOG) enema  960 mL Rectal BID   PRN Meds acetaminophen **OR** acetaminophen, dicyclomine, hydrALAZINE, levalbuterol, morphine injection, ondansetron **OR** ondansetron (ZOFRAN) IV, oxyCODONE Results for orders placed or performed during the hospital encounter of 05/14/15 (from the past 48 hour(s))  Glucose, capillary     Status: Abnormal   Collection Time: 05/18/15 12:05 PM  Result Value Ref Range   Glucose-Capillary 115 (H) 65 - 99 mg/dL  Glucose, capillary     Status: Abnormal   Collection Time: 05/18/15  4:53 PM  Result Value Ref Range   Glucose-Capillary 120 (H) 65 - 99 mg/dL  Glucose, capillary     Status: Abnormal   Collection Time: 05/18/15  9:38 PM  Result Value Ref Range   Glucose-Capillary 123 (H) 65 - 99 mg/dL  Basic metabolic panel  Status: Abnormal   Collection Time: 05/19/15  5:49 AM  Result Value Ref Range   Sodium 140 135 - 145 mmol/L   Potassium 3.7 3.5 - 5.1 mmol/L   Chloride 105 101 - 111 mmol/L   CO2 28 22 - 32 mmol/L   Glucose, Bld 104 (H) 65 - 99 mg/dL   BUN 44 (H) 6 - 20 mg/dL   Creatinine, Ser 1.64 (H) 0.44 - 1.00 mg/dL   Calcium 8.7 (L) 8.9 - 10.3 mg/dL   GFR calc non Af Amer 28 (L) >60 mL/min   GFR calc Af Amer 32 (L) >60 mL/min    Comment: (NOTE) The eGFR has been calculated using the CKD EPI equation. This calculation has not been validated in all clinical situations. eGFR's persistently <60 mL/min signify possible Chronic Kidney Disease.    Anion gap 7 5 - 15  Glucose, capillary     Status: Abnormal   Collection Time: 05/19/15  7:38 AM  Result Value Ref Range   Glucose-Capillary 110 (H) 65 - 99 mg/dL  Glucose, capillary     Status: Abnormal   Collection Time: 05/19/15 12:22 PM  Result Value Ref Range   Glucose-Capillary 160 (H) 65 - 99 mg/dL  Glucose, capillary     Status: Abnormal   Collection Time: 05/19/15  4:30 PM  Result  Value Ref Range   Glucose-Capillary 171 (H) 65 - 99 mg/dL  Glucose, capillary     Status: Abnormal   Collection Time: 05/19/15 10:23 PM  Result Value Ref Range   Glucose-Capillary 145 (H) 65 - 99 mg/dL   Comment 1 Document in Chart   Basic metabolic panel     Status: Abnormal   Collection Time: 05/20/15  4:59 AM  Result Value Ref Range   Sodium 143 135 - 145 mmol/L   Potassium 3.9 3.5 - 5.1 mmol/L   Chloride 107 101 - 111 mmol/L   CO2 27 22 - 32 mmol/L   Glucose, Bld 131 (H) 65 - 99 mg/dL   BUN 49 (H) 6 - 20 mg/dL   Creatinine, Ser 1.72 (H) 0.44 - 1.00 mg/dL   Calcium 9.0 8.9 - 10.3 mg/dL   GFR calc non Af Amer 26 (L) >60 mL/min   GFR calc Af Amer 30 (L) >60 mL/min    Comment: (NOTE) The eGFR has been calculated using the CKD EPI equation. This calculation has not been validated in all clinical situations. eGFR's persistently <60 mL/min signify possible Chronic Kidney Disease.    Anion gap 9 5 - 15  CBC     Status: Abnormal   Collection Time: 05/20/15  4:59 AM  Result Value Ref Range   WBC 5.9 4.0 - 10.5 K/uL   RBC 3.49 (L) 3.87 - 5.11 MIL/uL   Hemoglobin 9.5 (L) 12.0 - 15.0 g/dL   HCT 30.7 (L) 36.0 - 46.0 %   MCV 88.0 78.0 - 100.0 fL   MCH 27.2 26.0 - 34.0 pg   MCHC 30.9 30.0 - 36.0 g/dL   RDW 15.8 (H) 11.5 - 15.5 %   Platelets 218 150 - 400 K/uL  Glucose, capillary     Status: Abnormal   Collection Time: 05/20/15  8:00 AM  Result Value Ref Range   Glucose-Capillary 109 (H) 65 - 99 mg/dL    Ct Abdomen Pelvis Wo Contrast  05/19/2015   CLINICAL DATA:  Initial encounter for lower abdominal pain bilaterally  EXAM: CT ABDOMEN AND PELVIS WITHOUT CONTRAST  TECHNIQUE: Multidetector CT imaging of  the abdomen and pelvis was performed following the standard protocol without IV contrast.  COMPARISON:  04/22/2015.  FINDINGS: Lower chest: Small to moderate right pleural effusion is slightly progressive in the interval. There is adjacent right lower lobe collapse/ consolidation. Some  minimal compressive probable atelectasis is seen dependently in the left lower lobe.  Hepatobiliary: No focal abnormality in the liver on this study without intravenous contrast. No evidence for hepatomegaly. Gallbladder is contracted with tiny calcified stones evident. No intrahepatic or extrahepatic biliary dilation.  Pancreas: No focal mass lesion. No dilatation of the main duct. No intraparenchymal cyst. No peripancreatic edema.  Spleen: No splenomegaly. No focal mass lesion.  Adrenals/Urinary Tract: No adrenal nodule or mass. 19 mm low-density lesion in the interpolar right kidney is stable and likely represents a cyst. 15 mm hyper attenuating lesion is identified in the interpolar left kidney, stable. No evidence for hydroureter. Urinary bladder is unremarkable.  Stomach/Bowel: Stomach is nondistended. No gastric wall thickening. No evidence of outlet obstruction. Duodenum is normally positioned as is the ligament of Treitz. No small bowel wall thickening. No small bowel dilatation. Prominent stool volume noted throughout the colon up to the level of the sigmoid segment with no substantial stool from the mid sigmoid colon to the rectum. Although somewhat difficult to assess without intravenous contrast material, there is no evidence for colonic wall thickening.  Vascular/Lymphatic: There is abdominal aortic atherosclerosis without aneurysm. No lymphadenopathy in the abdomen or pelvis.  Reproductive: Uterus is surgically absent.  No adnexal mass.  Other: No intraperitoneal free fluid.  Musculoskeletal: There are 2 right para midline ventral hernias in the region of the umbilicus. Both contain only omental fat. The more cranial of the 2 which is just cranial to the umbilicus has some edema/fluid within the hernia sac as before a degree of fatty incarceration could have this appearance. There is no evidence for bowel loop in either hernia sac. Bones are diffusely demineralized without worrisome lytic or sclerotic  osseous abnormality. Degenerative changes are seen in the lumbar spine, most advanced at L3-4 and L4-5.  IMPRESSION: 1. Interval increase in colonic stool volume. Otherwise stable exam. 2. Stable appearance of the supraumbilical fat containing hernia with associated fluid/edema. Again, this raises the question of fatty incarceration. A second right para midline ventral hernia is also evident. There is no evidence for bowel loops in either hernia sac. 3. Stable appearance of the hyperdense left renal lesion, likely a complex cyst although neoplasm could have this appearance. 4. The thickening of the inferior left rectus muscle described previously is less evident on today's study. 5. Abdominal aortic atherosclerosis. 6. Slight increase in the small to moderate left pleural effusion. 7. Cholelithiasis.   Electronically Signed   By: Misty Stanley M.D.   On: 05/19/2015 17:39   Dg Abd Portable 1v  05/18/2015   CLINICAL DATA:  Three day history of constipation.  EXAM: PORTABLE ABDOMEN - 1 VIEW  COMPARISON:  Acute abdomen series 04/27/1015. CT abdomen pelvis 04/22/2015.  FINDINGS: Bowel gas pattern unremarkable without evidence of obstruction or significant ileus. Large stool burden in the visualized colon. Aortoiliac atherosclerosis without aneurysm. Adjacent large phleboliths in the right ovarian vein projected next to the L4 vertebral body. Degenerative changes involve the lumbar spine.  IMPRESSION: No acute abdominal abnormality.  Large colonic stool burden.   Electronically Signed   By: Evangeline Dakin M.D.   On: 05/18/2015 13:35   ROS: reviewed grossly positive G.I. symptoms as above  Blood pressure 159/41, pulse 52, temperature 97.7 F (36.5 C), temperature source Oral, resp. rate 16, height 5' 4"  (1.626 m), weight 78 kg (171 lb 15.3 oz), SpO2 93 %.  Physical exam:   General-- white female who does not appear to be in any distress. She somewhat reticent to answer questions. Nasal  cannula in place ENT-- nonicteric. Unable to open her mouth for exam. Neck-- no gross lymphadenopathy Heart-- regular rate and rhythm without murmurs or gallops Lungs-- clear Abdomen-- obese, bowel sounds present. Complains of tenderness throughout the abdomen with palpation but appears to be more localized in the lower abdomen. Psych-- alert but somewhat uncooperative.   Assessment:. 1. Abdominal pain/constipation. This appears to be a chronic problem. Her lower sigmoid did decompress with the enemas per CT scan. She is completely plugged up above this. No signs of diverticulitis on CT scan and her WBC is normal. I would wonder if there's some aspect of narcotic bowel. 2. Chronic narcotic use. The exact reason why she's taking so much narcotics is not entirely clear what could well be contributing significantly to her constipation. 3. Shortness of breath. Felt duty acute congestive heart failure with COPD exacerbation. She apparently is on chronic home O2. 4. CAD status post CABG 5. Diabetes type II 6. Hypertension 7. GERD. Appears to be stable on Protonix  Plan: 1. Since her WBC is normal I don't think her abdominal pain represents an acute inflammatory process. Her narcotics have been stopped acutely and this may be causing some of her symptoms. Not sure why she is on so much narcotics and she is unable to tell me. I would continue with the enemas at this point we will go ahead and add Miralax from above and place on a clear liquid diet for several days in an attempt to get her bowels moving.   Amahd Morino JR,Kashlynn Kundert L 05/20/2015, 9:15 AM   Pager: [phone] If no answer or after hours call [phone]

## 2015-05-20 NOTE — Progress Notes (Signed)
Patient was unable to hold the solution from [facility] enema, patient is not very cooperative and resulted with only small amount of hard stool. Daughter was at the bedside and helped while I performed the [facility] enema.

## 2015-05-20 NOTE — Progress Notes (Signed)
Resting in bed w/ eyes closed

## 2015-05-20 NOTE — Progress Notes (Signed)
Discussed w/ MD HR=29-31 momentarily when resting and 50's otherwise.He will review.EKG this AM. OOB to BSC X2 this AM no results from [facility] enema.

## 2015-05-21 DIAGNOSIS — K5900 Constipation, unspecified: Secondary | ICD-10-CM

## 2015-05-21 DIAGNOSIS — I495 Sick sinus syndrome: Secondary | ICD-10-CM

## 2015-05-21 DIAGNOSIS — I10 Essential (primary) hypertension: Secondary | ICD-10-CM

## 2015-05-21 LAB — GLUCOSE, CAPILLARY
GLUCOSE-CAPILLARY: 100 mg/dL — AB (ref 65–99)
GLUCOSE-CAPILLARY: 106 mg/dL — AB (ref 65–99)
GLUCOSE-CAPILLARY: 113 mg/dL — AB (ref 65–99)
Glucose-Capillary: 113 mg/dL — ABNORMAL HIGH (ref 65–99)
Glucose-Capillary: 115 mg/dL — ABNORMAL HIGH (ref 65–99)

## 2015-05-21 LAB — CBC
HCT: 28.2 % — ABNORMAL LOW (ref 36.0–46.0)
Hemoglobin: 8.5 g/dL — ABNORMAL LOW (ref 12.0–15.0)
MCH: 26.3 pg (ref 26.0–34.0)
MCHC: 30.1 g/dL (ref 30.0–36.0)
MCV: 87.3 fL (ref 78.0–100.0)
PLATELETS: 185 10*3/uL (ref 150–400)
RBC: 3.23 MIL/uL — ABNORMAL LOW (ref 3.87–5.11)
RDW: 15.7 % — AB (ref 11.5–15.5)
WBC: 8 10*3/uL (ref 4.0–10.5)

## 2015-05-21 LAB — BASIC METABOLIC PANEL
ANION GAP: 10 (ref 5–15)
BUN: 44 mg/dL — ABNORMAL HIGH (ref 6–20)
CO2: 26 mmol/L (ref 22–32)
Calcium: 8.8 mg/dL — ABNORMAL LOW (ref 8.9–10.3)
Chloride: 108 mmol/L (ref 101–111)
Creatinine, Ser: 1.68 mg/dL — ABNORMAL HIGH (ref 0.44–1.00)
GFR calc Af Amer: 31 mL/min — ABNORMAL LOW (ref >60)
GFR calc non Af Amer: 27 mL/min — ABNORMAL LOW (ref >60)
Glucose, Bld: 110 mg/dL — ABNORMAL HIGH (ref 65–99)
POTASSIUM: 3.9 mmol/L (ref 3.5–5.1)
Sodium: 144 mmol/L (ref 135–145)

## 2015-05-21 MED ORDER — MORPHINE SULFATE 2 MG/ML IJ SOLN: 1.0000 mg | INTRAMUSCULAR | Status: DC | PRN

## 2015-05-21 MED ORDER — OXYCODONE HCL 5 MG PO TABS: 2.5000 mg | ORAL_TABLET | Freq: Two times a day (BID) | ORAL | Status: DC | PRN

## 2015-05-21 MED ORDER — POLYETHYLENE GLYCOL 3350 17 G PO PACK
17.0000 g | PACK | Freq: Four times a day (QID) | ORAL | Status: DC
  Administered 2015-05-21 (×2): 17 g via ORAL
  Filled 2015-05-21 (×3): qty 1

## 2015-05-21 NOTE — Progress Notes (Signed)
Pt insisted on regular diet Md notified Dr Waymon Amato  Does not want to advance at this time

## 2015-05-21 NOTE — Progress Notes (Signed)
Attempted SMOG enema pt took approx 300 ml but could not hold it at and refused to complete.Digital check for impaction done per Dr Rae Mar verbal order and negative for impaction.will closely monitor.

## 2015-05-21 NOTE — Progress Notes (Signed)
Refused PM SMOG--daughter gave her glycerin enema without nursing staff knowledge. Educated pt and daughter concerning home med administration during stay. SRP, RN

## 2015-05-21 NOTE — Progress Notes (Signed)
[facility] PROGRESS NOTE  Leslie Parrish WUJ:[id number] DOB: 04-Nov-1927 DOA: 05/14/2015 PCP: No primary care provider on file.   Interim Summary: 81yo with hx of combined systolic/diastolic CHF initially presented with abd pain and findings worrisome for ESBL uti. The patient was continued on IV abx per ID recs. During this course, the patient reported lower abd pain. Imaging confirmed significant stool burden. Unfortunately, the patient would not tolerate PO cathartics as well as enemas and GI consulted. At present, the patient is continued on scheduled miralax and scheduled SMOG enemas per GI recs, awaiting results. Cardiology consulted on 7/27 due to concern for tachybradycardia.  Assessment/Plan:  SOB suspect secondary to acute CHF with copd exacerbation - Treated etiology as below - Improved  Acute on chronic systolic CHF (LVEF 30-35 percent) - Treated with IV Lasix with good effect. Lasix currently on hold. - 2-D echo results as below. - TSH: Normal - Appears euvolemic - Cardiology input appreciated  CAD s/p CABG in 2012 - Serial troponins negative and 2-D echo without wall motion abnormalities - Continue aspirin - No chest pain.  Possible mild COPD exacerbation - Patient declined steroids. Treated with when necessary bronchodilators nebs - Acute exacerbation resolved.  DM2, controlled - will cont on SSI coverage - A1c 6.2  HTN - BP poorly controlled - Continue with amlodipine and hydralazine. Clonidine dose was reduced secondary to bradycardia - ARB on hold secondary to acute kidney injury - Cont PRN hydralazine for sbp>180  Acute on stage IV chronic kidney disease - Baseline creatinine probably in the 1.6-1.7 range - Creatinine had peaked to 2 but has gradually improved to 1.68 - Diuretics currently on hold  GERD - Cont PPI  UTI, possibly ESBL E coli - Recent UTI with ESBL ecoli organism - Dr. Rhona Leavens discussed with ID. Given pcn allergy hx, completed 5 days  of Invanz  Abd pain likely secondary to constipation - Large stool burden seen on abd xray, increased stool amount on follow up CT abd associated with lower quadrant pain - Minimal results with SMOG enema and pt has been refusing bowel prep recently - GI consultation and follow-up appreciated: Difficult to separate functional from Trail issues. No clear obstruction on CT. Patient passing flatus. Patient agreeing to MiraLAX-diet being advanced gradually by GI. Enema stopped-patient declining. Requested nursing for manual disimpaction.  Tachybradycardia - Respiratory review of telemetry on 7/27: Patient had severe sinus bradycardia in the mid 20s on 7/26 and had periods of nonsustained VT on 7/23 and 7/24 - Clonidine dose was reduced due to sinus bradycardia - Cardiology consultation appreciated: Avoid beta blockers or any AV nodal blocking agents if possible. Continue low-dose clonidine.  Chronic anemia - stable     DVT prophylaxis: Subcutaneous heparin Code Status: Full Family Communication: None at bedside Disposition Plan: Home when stable   Consultants:  [facility]  Cardiology  Procedures:    Antibiotics:  Ertapenem 7/21>>>7/25  HPI/Subjective: Passing flatus. No BM. Refusing enemas and asking for regular diet. No chest pain or dyspnea.  Objective: Filed Vitals:   05/21/15 0836 05/21/15 0948 05/21/15 1423 05/21/15 1544  BP:  186/56  153/46  Pulse:    64  Temp:    98.4 F (36.9 C)  TempSrc:    Oral  Resp:    20  Height:      Weight:      SpO2: 95%  98% 98%    Intake/Output Summary (Last 24 hours) at 05/21/15 1739 Last data filed at 05/21/15 [phone]  Gross per 24 hour  Intake 1348.75 ml  Output      0 ml  Net 1348.75 ml   Filed Weights   05/17/15 0527 05/19/15 0432 05/20/15 0500  Weight: 78.9 kg (173 lb 15.1 oz) 78 kg (171 lb 15.3 oz) 78 kg (171 lb 15.3 oz)    Exam:   General:  Pleasant elderly female sitting up comfortably in  chair.  Cardiovascular: S1 and S2 heard, RRR. No JVD, murmurs or pedal edema. Telemetry: Sinus rhythm with BBB morphology. Transient sinus bradycardia in the mid 20s on 7/26 and nonsustained VT on 7/24 and 7/23  Respiratory: Clear to auscultation. No increased work of breathing  Abdomen: Nondistended, soft and nontender. Normal bowel sounds heard.   CNS: Alert and oriented. No focal neurological deficits   Musculoskeletal: perfused, no clubbing, no cyanosis  Data Reviewed: Basic Metabolic Panel:  Recent Labs Lab 05/17/15 0513 05/18/15 0520 05/19/15 0549 05/20/15 0459 05/21/15 0840  NA 140 142 140 143 144  K 4.2 4.3 3.7 3.9 3.9  CL 102 105 105 107 108  CO2 29 27 28 27 26   GLUCOSE 133* 113* 104* 131* 110*  BUN 52* 51* 44* 49* 44*  CREATININE 2.00* 1.78* 1.64* 1.72* 1.68*  CALCIUM 8.8* 8.9 8.7* 9.0 8.8*   Liver Function Tests:  Recent Labs Lab 05/15/15 0200  AST 13*  ALT 6*  ALKPHOS 43  BILITOT 0.4  PROT 6.0*  ALBUMIN 3.2*   No results for input(s): LIPASE, AMYLASE in the last 168 hours. No results for input(s): AMMONIA in the last 168 hours. CBC:  Recent Labs Lab 05/15/15 0200 05/16/15 0417 05/17/15 0513 05/20/15 0459 05/21/15 0500  WBC 5.6 5.8 5.5 5.9 8.0  HGB 8.6* 8.6* 9.4* 9.5* 8.5*  HCT 27.7* 27.9* 30.0* 30.7* 28.2*  MCV 87.7 87.5 88.0 88.0 87.3  PLT 177 183 203 218 185   Cardiac Enzymes:  Recent Labs Lab 05/14/15 2045 05/15/15 0200 05/15/15 0820  TROPONINI 0.03 0.03 0.04*   BNP (last 3 results)  Recent Labs  05/14/15 1454  BNP 1487.4*    ProBNP (last 3 results) No results for input(s): PROBNP in the last 8760 hours.  CBG:  Recent Labs Lab 05/20/15 1656 05/20/15 2230 05/21/15 0749 05/21/15 1211 05/21/15 1547  GLUCAP 99 113* 100* 106* 115*    Recent Results (from the past 240 hour(s))  Urine culture     Status: None   Collection Time: 05/14/15  5:26 PM  Result Value Ref Range Status   Specimen Description URINE, CLEAN  CATCH  Final   Special Requests NONE  Final   Culture   Final    MULTIPLE SPECIES PRESENT, SUGGEST RECOLLECTION IF CLINICALLY INDICATED Performed at [facility]    Report Status 05/15/2015 FINAL  Final     Studies: No results found.  Scheduled Meds: . amLODipine  10 mg Oral Daily  . anastrozole  1 mg Oral Daily  . aspirin EC  81 mg Oral Daily  . cloNIDine  0.1 mg Oral BID  . heparin  5,000 Units Subcutaneous 3 times per day  . hydrALAZINE  100 mg Oral TID  . insulin aspart  0-15 Units Subcutaneous TID WC  . insulin aspart  0-5 Units Subcutaneous QHS  . ipratropium  0.5 mg Nebulization TID  . levalbuterol  0.63 mg Nebulization TID  . pantoprazole  40 mg Oral Daily  . polyethylene glycol  17 g Oral QID  . sodium chloride  3 mL Intravenous Q12H  Continuous Infusions: . sodium chloride 75 mL/hr at 05/20/15 1900    Active Problems:   CHF (congestive heart failure)   Diabetes mellitus type 2 without retinopathy   HTN (hypertension)   COPD with acute exacerbation   ARF (acute renal failure)   Acute CHF    Leslie Osias, MD, FACP, FHM. [facility] Pager [phone]  If 7PM-7AM, please contact night-coverage [email or web address] Password [facility] 05/21/2015, 5:39 PM   LOS: 7 days

## 2015-05-21 NOTE — Progress Notes (Signed)
[facility] PROGRESS NOTE Subjective Pt states no BM passing flatus refusing Miralax and enemas wants food.  Objective: Vital signs in last 24 hours: Temp:  [97.7 F (36.5 C)-99.3 F (37.4 C)] 99.3 F (37.4 C) (07/27 0526) Pulse Rate:  [45-70] 70 (07/27 0526) Resp:  [16-18] 18 (07/27 0526) BP: (97-186)/(27-79) 186/56 mmHg (07/27 0948) SpO2:  [95 %-100 %] 95 % (07/27 0836) Last BM Date:  (unknown)  Intake/Output from previous day: 07/26 0701 - 07/27 0700 In: 1588.8 [P.O.:600; I.V.:988.8] Out: -  Intake/Output this shift: Total I/O In: 120 [P.O.:120] Out: -   PE: General--laying on side, states cant breathe on back  Abdomen--obese, less tender  + BSs  Lab Results:  Recent Labs  05/20/15 0459 05/21/15 0500  WBC 5.9 8.0  HGB 9.5* 8.5*  HCT 30.7* 28.2*  PLT 218 185   BMET  Recent Labs  05/19/15 0549 05/20/15 0459 05/21/15 0840  NA 140 143 144  K 3.7 3.9 3.9  CL 105 107 108  CO2 28 27 26   CREATININE 1.64* 1.72* 1.68*   LFT No results for input(s): PROT, AST, ALT, ALKPHOS, BILITOT, BILIDIR, IBILI in the last 72 hours. PT/INR No results for input(s): LABPROT, INR in the last 72 hours. PANCREAS No results for input(s): LIPASE in the last 72 hours.       Studies/Results: Ct Abdomen Pelvis Wo Contrast  05/19/2015   CLINICAL DATA:  Initial encounter for lower abdominal pain bilaterally  EXAM: CT ABDOMEN AND PELVIS WITHOUT CONTRAST  TECHNIQUE: Multidetector CT imaging of the abdomen and pelvis was performed following the standard protocol without IV contrast.  COMPARISON:  04/22/2015.  FINDINGS: Lower chest: Small to moderate right pleural effusion is slightly progressive in the interval. There is adjacent right lower lobe collapse/ consolidation. Some minimal compressive probable atelectasis is seen dependently in the left lower lobe.  Hepatobiliary: No focal abnormality in the liver on this study without intravenous contrast. No evidence for  hepatomegaly. Gallbladder is contracted with tiny calcified stones evident. No intrahepatic or extrahepatic biliary dilation.  Pancreas: No focal mass lesion. No dilatation of the main duct. No intraparenchymal cyst. No peripancreatic edema.  Spleen: No splenomegaly. No focal mass lesion.  Adrenals/Urinary Tract: No adrenal nodule or mass. 19 mm low-density lesion in the interpolar right kidney is stable and likely represents a cyst. 15 mm hyper attenuating lesion is identified in the interpolar left kidney, stable. No evidence for hydroureter. Urinary bladder is unremarkable.  Stomach/Bowel: Stomach is nondistended. No gastric wall thickening. No evidence of outlet obstruction. Duodenum is normally positioned as is the ligament of Treitz. No small bowel wall thickening. No small bowel dilatation. Prominent stool volume noted throughout the colon up to the level of the sigmoid segment with no substantial stool from the mid sigmoid colon to the rectum. Although somewhat difficult to assess without intravenous contrast material, there is no evidence for colonic wall thickening.  Vascular/Lymphatic: There is abdominal aortic atherosclerosis without aneurysm. No lymphadenopathy in the abdomen or pelvis.  Reproductive: Uterus is surgically absent.  No adnexal mass.  Other: No intraperitoneal free fluid.  Musculoskeletal: There are 2 right para midline ventral hernias in the region of the umbilicus. Both contain only omental fat. The more cranial of the 2 which is just cranial to the umbilicus has some edema/fluid within the hernia sac as before a degree of fatty incarceration could have this appearance. There is no evidence for bowel loop in either hernia sac. Bones are diffusely demineralized without  worrisome lytic or sclerotic osseous abnormality. Degenerative changes are seen in the lumbar spine, most advanced at L3-4 and L4-5.  IMPRESSION: 1. Interval increase in colonic stool volume. Otherwise stable exam. 2.  Stable appearance of the supraumbilical fat containing hernia with associated fluid/edema. Again, this raises the question of fatty incarceration. A second right para midline ventral hernia is also evident. There is no evidence for bowel loops in either hernia sac. 3. Stable appearance of the hyperdense left renal lesion, likely a complex cyst although neoplasm could have this appearance. 4. The thickening of the inferior left rectus muscle described previously is less evident on today's study. 5. Abdominal aortic atherosclerosis. 6. Slight increase in the small to moderate left pleural effusion. 7. Cholelithiasis.   Electronically Signed   By: Kennith Center M.D.   On: 05/19/2015 17:39    Medications: I have reviewed the patient's current medications.  Assessment/Plan: 1. Abdominal Pain/constipation. Difficult to separate functional from real issues. No clear obstruction on CT and is passing flatus. She agreed to take the miralax if we will advance diet, will try full liquids. Will stop enemas.   Leslie Parrish JR,Christoph Copelan L 05/21/2015, 10:49 AM  Pager: [phone] If no answer or after hours call [phone]

## 2015-05-21 NOTE — Consult Note (Signed)
CARDIOLOGY CONSULT NOTE  Patient ID: Leslie Parrish, MRN: [id number], DOB/AGE: April 14, 1928 81 y.o. Admit date: 05/14/2015 Date of Consult: 05/21/2015  Primary Physician: No primary care provider on file. Primary Cardiologist: none Referring Physician: Dr Waymon Amato  Chief Complaint: abdominal pain Reason for Consultation: Bradycardia/tachycardia  HPI: 81 year old woman with chronic systolic heart failure, coronary artery disease status post CABG, chronic respiratory failure on home oxygen, was hospitalized with shortness of breath. She was felt to have COPD exacerbation as well as some evidence of congestive heart failure with elevated BNP and bilateral pleural effusions.  The patient has now been in the hospital for about one week. She appears to be chronically ill and she is a poor historian. She is able to tell May that she's had previous bypass surgery and was followed by a cardiologist in [facility] for chronic heart failure. She states that she has not been hospitalized with heart failure in the past. She currently denies chest pain, shortness of breath, leg swelling, orthopnea, or PND. Her biggest complaint at present is abdominal discomfort. She has been seen by GI and is felt to have constipation related to chronic narcotic use. She is being treated with enemas and MiraLAX.  The patient has had episodes of bradycardia on telemetry. Her heart rate has been as low as 30 bpm at times. Clonidine was discontinued because of bradycardic episodes. She has also been seen to have frequent PVCs, both isolated and consecutive. She has also had what appears to be atrial tachycardia with aberrancy.  Medical History:  Past Medical History  Diagnosis Date  . Hypertension   . CHF (congestive heart failure)   . GERD (gastroesophageal reflux disease)   . COPD (chronic obstructive pulmonary disease)   . Asthma   . Diabetes mellitus without complication   . UTI (lower urinary tract  infection)   . CAD (coronary artery disease)       Surgical History:  Past Surgical History  Procedure Laterality Date  . Abdominal hysterectomy    . Appendectomy    . Coronary artery bypass graft       Home Meds: Prior to Admission medications   Medication Sig Start Date End Date Taking? Authorizing Provider  amLODipine (NORVASC) 10 MG tablet Take 10 mg by mouth daily.   Yes Historical Provider, MD  anastrozole (ARIMIDEX) 1 MG tablet Take 1 mg by mouth daily.   Yes Historical Provider, MD  aspirin EC 81 MG tablet Take 81 mg by mouth daily.   Yes Historical Provider, MD  candesartan (ATACAND) 32 MG tablet Take 32 mg by mouth daily.   Yes Historical Provider, MD  Cholecalciferol (VITAMIN D-3 PO) Take 1 tablet by mouth daily.   Yes Historical Provider, MD  cloNIDine (CATAPRES) 0.2 MG tablet Take 0.2 mg by mouth 2 (two) times daily.   Yes Historical Provider, MD  dicyclomine (BENTYL) 20 MG tablet Take 20 mg by mouth daily as needed for spasms.   Yes Historical Provider, MD  hydrALAZINE (APRESOLINE) 100 MG tablet Take 100 mg by mouth 3 (three) times daily.   Yes Historical Provider, MD  ketoconazole (NIZORAL) 2 % cream Apply 1 application topically at bedtime. 12/21/14  Yes Historical Provider, MD  levalbuterol Pauline Aus) 1.25 MG/3ML nebulizer solution Inhale 1 ampule into the lungs 4 (four) times daily. 12/23/14  Yes Historical Provider, MD  ondansetron (ZOFRAN ODT) 4 MG disintegrating tablet Take 1 tablet (4 mg total) by mouth every 8 (eight) hours as needed for nausea or  vomiting. 04/27/15  Yes Jerelyn Scott, MD  oxyCODONE (OXY IR/ROXICODONE) 5 MG immediate release tablet Take 0.5 tablets by mouth 2 (two) times daily as needed. pain 04/13/15  Yes Historical Provider, MD  oxyCODONE-acetaminophen (PERCOCET/ROXICET) 5-325 MG per tablet Take 1-2 tablets by mouth every 6 (six) hours as needed for severe pain. 04/27/15  Yes Jerelyn Scott, MD  pantoprazole (PROTONIX) 40 MG tablet Take 40 mg by mouth  daily.   Yes Historical Provider, MD  potassium chloride SA (K-DUR,KLOR-CON) 20 MEQ tablet Take 20 mEq by mouth daily.   Yes Historical Provider, MD  torsemide (DEMADEX) 20 MG tablet Take 20 mg by mouth daily.   Yes Historical Provider, MD  cephALEXin (KEFLEX) 500 MG capsule Take 1 capsule (500 mg total) by mouth 3 (three) times daily. Patient not taking: Reported on 05/14/2015 05/08/15   Azalia Bilis, MD    Inpatient Medications:  . amLODipine  10 mg Oral Daily  . anastrozole  1 mg Oral Daily  . aspirin EC  81 mg Oral Daily  . cloNIDine  0.1 mg Oral BID  . heparin  5,000 Units Subcutaneous 3 times per day  . hydrALAZINE  100 mg Oral TID  . insulin aspart  0-15 Units Subcutaneous TID WC  . insulin aspart  0-5 Units Subcutaneous QHS  . ipratropium  0.5 mg Nebulization TID  . levalbuterol  0.63 mg Nebulization TID  . pantoprazole  40 mg Oral Daily  . polyethylene glycol  17 g Oral QID  . sodium chloride  3 mL Intravenous Q12H   . sodium chloride 75 mL/hr at 05/20/15 1900    Allergies:  Allergies  Allergen Reactions  . Albuterol Sulfate     Per Md- does not want her taking because it raises her blood pressure.   Marland Kitchen Penicillins Swelling  . Sulfa Antibiotics Swelling  . Compazine [Prochlorperazine Edisylate] Palpitations    History   Social History  . Marital Status: Single    Spouse Name: N/A  . Number of Children: N/A  . Years of Education: N/A   Occupational History  . Not on file.   Social History Main Topics  . Smoking status: Never Smoker   . Smokeless tobacco: Not on file  . Alcohol Use: No  . Drug Use: No  . Sexual Activity: No   Other Topics Concern  . Not on file   Social History Narrative     Family History  Problem Relation Age of Onset  . Family history unknown: Yes     Review of Systems: General: negative for chills, fever, night sweats or weight changes.  ENT: negative for rhinorrhea or epistaxis Cardiovascular: See history of present illness    Dermatological: negative for rash Respiratory: Positive for cough and wheezing GI: Positive for abdominal pain and constipation GU: no hematuria, urgency, or frequency Neurologic: negative for visual changes, syncope, headache, or dizziness Heme: no easy bruising or bleeding Endo: negative for excessive thirst, thyroid disorder, or flushing Musculoskeletal: negative for joint pain or swelling, negative for myalgias  All other systems reviewed and are otherwise negative except as noted above.  Physical Exam: Blood pressure 186/56, pulse 70, temperature 99.3 F (37.4 C), temperature source Oral, resp. rate 18, height 5\' 4"  (1.626 m), weight 171 lb 15.3 oz (78 kg), SpO2 95 %. Pt is alert. Elderly woman in no distress. HEENT: normal Neck: JVP normal. Carotid upstrokes normal without bruits. No thyromegaly. Lungs: equal expansion, clear bilaterally CV: Regular rate and rhythm with grade 2/6  systolic murmur at the left lower sternal border Abd: soft, diffuse abdominal tenderness to palpation without rebound or guarding Back: no CVA tenderness Ext: no C/C/E        DP/PT pulses intact and = Skin: warm and dry without rash Neuro: CNII-XII intact             Strength intact = bilaterally    Labs: No results for input(s): CKTOTAL, CKMB, TROPONINI in the last 72 hours. Lab Results  Component Value Date   WBC 8.0 05/21/2015   HGB 8.5* 05/21/2015   HCT 28.2* 05/21/2015   MCV 87.3 05/21/2015   PLT 185 05/21/2015    Recent Labs Lab 05/15/15 0200  05/21/15 0840  NA 142  < > 144  K 3.9  < > 3.9  CL 106  < > 108  CO2 30  < > 26  BUN 43*  < > 44*  CREATININE 1.87*  < > 1.68*  CALCIUM 8.4*  < > 8.8*  PROT 6.0*  --   --   BILITOT 0.4  --   --   ALKPHOS 43  --   --   ALT 6*  --   --   AST 13*  --   --   GLUCOSE 163*  < > 110*  < > = values in this interval not displayed. Lab Results  Component Value Date   CHOL 121 05/15/2015   HDL 21* 05/15/2015   LDLCALC 49 05/15/2015   TRIG  254* 05/15/2015   No results found for: DDIMER  Radiology/Studies:  Ct Abdomen Pelvis Wo Contrast  05/19/2015   CLINICAL DATA:  Initial encounter for lower abdominal pain bilaterally  EXAM: CT ABDOMEN AND PELVIS WITHOUT CONTRAST  TECHNIQUE: Multidetector CT imaging of the abdomen and pelvis was performed following the standard protocol without IV contrast.  COMPARISON:  04/22/2015.  FINDINGS: Lower chest: Small to moderate right pleural effusion is slightly progressive in the interval. There is adjacent right lower lobe collapse/ consolidation. Some minimal compressive probable atelectasis is seen dependently in the left lower lobe.  Hepatobiliary: No focal abnormality in the liver on this study without intravenous contrast. No evidence for hepatomegaly. Gallbladder is contracted with tiny calcified stones evident. No intrahepatic or extrahepatic biliary dilation.  Pancreas: No focal mass lesion. No dilatation of the main duct. No intraparenchymal cyst. No peripancreatic edema.  Spleen: No splenomegaly. No focal mass lesion.  Adrenals/Urinary Tract: No adrenal nodule or mass. 19 mm low-density lesion in the interpolar right kidney is stable and likely represents a cyst. 15 mm hyper attenuating lesion is identified in the interpolar left kidney, stable. No evidence for hydroureter. Urinary bladder is unremarkable.  Stomach/Bowel: Stomach is nondistended. No gastric wall thickening. No evidence of outlet obstruction. Duodenum is normally positioned as is the ligament of Treitz. No small bowel wall thickening. No small bowel dilatation. Prominent stool volume noted throughout the colon up to the level of the sigmoid segment with no substantial stool from the mid sigmoid colon to the rectum. Although somewhat difficult to assess without intravenous contrast material, there is no evidence for colonic wall thickening.  Vascular/Lymphatic: There is abdominal aortic atherosclerosis without aneurysm. No lymphadenopathy  in the abdomen or pelvis.  Reproductive: Uterus is surgically absent.  No adnexal mass.  Other: No intraperitoneal free fluid.  Musculoskeletal: There are 2 right para midline ventral hernias in the region of the umbilicus. Both contain only omental fat. The more cranial of the 2 which is just cranial  to the umbilicus has some edema/fluid within the hernia sac as before a degree of fatty incarceration could have this appearance. There is no evidence for bowel loop in either hernia sac. Bones are diffusely demineralized without worrisome lytic or sclerotic osseous abnormality. Degenerative changes are seen in the lumbar spine, most advanced at L3-4 and L4-5.  IMPRESSION: 1. Interval increase in colonic stool volume. Otherwise stable exam. 2. Stable appearance of the supraumbilical fat containing hernia with associated fluid/edema. Again, this raises the question of fatty incarceration. A second right para midline ventral hernia is also evident. There is no evidence for bowel loops in either hernia sac. 3. Stable appearance of the hyperdense left renal lesion, likely a complex cyst although neoplasm could have this appearance. 4. The thickening of the inferior left rectus muscle described previously is less evident on today's study. 5. Abdominal aortic atherosclerosis. 6. Slight increase in the small to moderate left pleural effusion. 7. Cholelithiasis.   Electronically Signed   By: Kennith Center M.D.   On: 05/19/2015 17:39   Ct Abdomen Pelvis Wo Contrast  04/22/2015   CLINICAL DATA:  Left lower quadrant abdominal pain. Urinary tract infection.  EXAM: CT ABDOMEN AND PELVIS WITHOUT CONTRAST  TECHNIQUE: Multidetector CT imaging of the abdomen and pelvis was performed following the standard protocol without IV contrast.  COMPARISON:  None.  FINDINGS: Lower chest: Coronary atherosclerosis. Mild aortic and mitral valve calcification. Low-density blood pool. Mild cardiomegaly.  Small to moderate right pleural effusion,  nonspecific for transudative versus exudative etiology.  6 mm left lower lobe pulmonary nodule, image 3 series 3.  Hepatobiliary: Small densities in the contracted gallbladder, suspicious for gallstones.  Pancreas: Unremarkable  Spleen: Unremarkable  Adrenals/Urinary Tract: Right kidney upper pole exophytic hypodense 1.6 cm lesion, internal density -6 Hounsfield units. Left anterior mid kidney 1.6 hyper dense lesion, internal density 56 Hounsfield units. No hydronephrosis or hydroureter. Vascular calcifications in both renal hila.  Stomach/Bowel: Sigmoid diverticulosis.  Vascular/Lymphatic: Dense aortic and branch vessel atherosclerotic vascular disease. Left common iliac node 1.0 cm in short axis, borderline enlarged.  Reproductive: Uterus absent.  Ovaries not well seen.  Other: No supplemental non-categorized findings.  Musculoskeletal: Ventral supraumbilical hernia measuring 6.1 by 6.3 by 2.9 cm contains fluid and fat density. No herniated bowel. There is also a right paracentral ventral hernia at the level of the umbilicus containing only adipose tissue.  Lumbar spondylosis and degenerative disc disease with grade 1 retrolisthesis at L3-4 and grade 1 anterolisthesis at L4-5. Endplate irregularity is likely due to Schmorl' s nodes and degenerative endplate findings at the L3-4 level. Left lateral recess foraminal disc protrusion at L2-3; left paracentral disc protrusion at L3-4 ; extensive facet arthropathy at L4-5, with suspected impingement at each of these levels.  IMPRESSION: 1. Supraumbilical hernia contains adipose tissue and fluid, and could represent inflamed or ischemic herniated omental adipose tissue. Just below this there is another right paracentral ventral hernia containing only adipose tissue. 2. Abnormal fluid/thickening along the posterior margin of the left inferior rectus abdominus muscle, possibly postoperative although a small hematoma in this vicinity is not excluded. 3. Hyperdense left  anterior mid kidney lesion, probably a complex cyst although mass is not excluded given the lack of IV contrast. 4. Extensive atherosclerosis including the coronary arteries, with aortic and mitral valve calcification as well. 5. Low-density blood pool suggests anemia. 6. Mild cardiomegaly. 7. Small to moderate right pleural effusion. 8. 6 mm left lower lobe pulmonary nodule. If the patient is  at high risk for bronchogenic carcinoma, follow-up chest CT at 6-12 months is recommended. If the patient is at low risk for bronchogenic carcinoma, follow-up chest CT at 12 months is recommended. This recommendation follows the consensus statement: Guidelines for Management of Small Pulmonary Nodules Detected on CT Scans: A Statement from the Fleischner Society as published in Radiology 2005;237:395-400. 9. Sigmoid diverticulosis without active diverticulitis. 10. Lumbar spondylosis and degenerative disc disease causing lower lumbar impingement. 11. Cholelithiasis   Electronically Signed   By: Gaylyn Rong M.D.   On: 04/22/2015 16:37   Dg Chest 2 View  05/14/2015   CLINICAL DATA:  shortness of breath.  Recent right pleural effusion.  EXAM: CHEST  2 VIEW  COMPARISON:  04/27/2015, 05/08/2015.  FINDINGS: prior median sternotomy. Small pleural effusions noted bilaterally, larger on the right. There is associated bibasilar atelectasis. Upper lungs remain clear. No pneumothorax. Atherosclerosis of the aorta. Degenerative changes of the left shoulder and spine. No compression fracture  IMPRESSION: Cardiomegaly with persistent small effusions, large on the right. Associated bibasilar atelectasis. Stable postop changes.   Electronically Signed   By: Judie Petit.  Shick M.D.   On: 05/14/2015 14:35   Dg Chest 2 View  05/08/2015   CLINICAL DATA:  Shortness of breath, recent diagnosis of pneumonia  EXAM: CHEST - 2 VIEW  COMPARISON:  04/30/2015  FINDINGS: Cardiac shadow is again enlarged. Postsurgical changes are again seen. Bibasilar  changes are again noted but improved slightly in the interval from the prior exam persistent right effusion is seen. No acute bony abnormality is noted.  IMPRESSION: Slight improved aeration in the bases bilaterally   Electronically Signed   By: Alcide Clever M.D.   On: 05/08/2015 16:21   Dg Chest 2 View  04/30/2015   CLINICAL DATA:  Shortness of breath beginning at 5:00 a.m. today. History of COPD.  EXAM: CHEST  2 VIEW  COMPARISON:  Single view of the chest 04/27/2015. CT abdomen and pelvis 04/22/2015 also reviewed.  FINDINGS: Right pleural effusion and basilar airspace disease to do not appear markedly changed. Patchy left basilar airspace disease is also stable in appearance. There is no pulmonary edema. The patient is status post CABG. Heart size is enlarged.  IMPRESSION: No marked change in right greater than left basilar airspace disease and a small right effusion. No new abnormality.   Electronically Signed   By: Drusilla Kanner M.D.   On: 04/30/2015 16:31   Dg Abd Acute W/chest  04/27/2015   CLINICAL DATA:  Abdominal pain and nausea. Constipation. Shortness of breath.  EXAM: DG ABDOMEN ACUTE W/ 1V CHEST  COMPARISON:  CT scan dated 04/22/2015  FINDINGS: There is a small right pleural effusion. There is cardiomegaly. Pulmonary vascularity is normal.  No free air in the abdomen. Bowel gas pattern is normal. Contrast from previous CT scan is now in the distal colon. Extensive vascular calcification in the abdomen. Diffuse degenerative changes in the lumbar spine.  IMPRESSION: No acute abnormalities of the abdomen. Right pleural effusion, unchanged since the prior exam.   Electronically Signed   By: Francene Boyers M.D.   On: 04/27/2015 16:43   Dg Abd Portable 1v  05/18/2015   CLINICAL DATA:  Three day history of constipation.  EXAM: PORTABLE ABDOMEN - 1 VIEW  COMPARISON:  Acute abdomen series 04/27/1015. CT abdomen pelvis 04/22/2015.  FINDINGS: Bowel gas pattern unremarkable without evidence of obstruction  or significant ileus. Large stool burden in the visualized colon. Aortoiliac atherosclerosis without aneurysm. Adjacent large  phleboliths in the right ovarian vein projected next to the L4 vertebral body. Degenerative changes involve the lumbar spine.  IMPRESSION: No acute abdominal abnormality.  Large colonic stool burden.   Electronically Signed   By: Hulan Saas M.D.   On: 05/18/2015 13:35    EKG: Normal sinus rhythm 72 bpm, first degree AV block, left bundle branch block.  Tele: Personally reviewed: Sinus rhythm, periods of bradycardia and episodes of PVCs, both single and consecutive. There are short runs of nonsustained VT and longer runs of atrial tachycardia with aberrancy  Cardiac Studies: 2-D echocardiogram 05/16/2015: Study Conclusions  - Left ventricle: The cavity size was normal. There was mild concentric hypertrophy. Systolic function was moderately to severely reduced. The estimated ejection fraction was in the range of 30% to 35%. Wall motion was normal; there were no regional wall motion abnormalities. Features are consistent with a pseudonormal left ventricular filling pattern, with concomitant abnormal relaxation and increased filling pressure (grade 2 diastolic dysfunction). Doppler parameters are consistent with severely elevated ventricular end-diastolic filling pressure. - Ventricular septum: Septal motion showed paradox. - Aortic root: The aortic root was normal in size. - Mitral valve: Moderately calcified leaflets including chordal apparatus and moderately restricted leaflet opening. Mobility was restricted. The findings are consistent with mild stenosis. There was mild regurgitation. Valve area by continuity equation (using LVOT flow): 0.82 cm^2. - Left atrium: The atrium was moderately dilated. - Right ventricle: The cavity size was normal. Wall thickness was normal. Systolic function was moderately reduced. - Right atrium: The  atrium was normal in size. - Tricuspid valve: There was mild regurgitation. - Pulmonic valve: There was trivial regurgitation. - Pulmonary arteries: Systolic pressure was within the normal range. - Inferior vena cava: The vessel was dilated. The respirophasic diameter changes were blunted (< 50%), consistent with elevated central venous pressure. - Pericardium, extracardiac: There was no pericardial effusion.   ASSESSMENT AND PLAN:  1. Acute on chronic systolic heart failure with LVEF 30-35% 2. CAD status post CABG, no active symptoms of angina 3. Nonsustained VT/atrial tachycardia with aberrancy/periods of marked sinus bradycardia 4. Type 2 diabetes 5. Hypertension, uncontrolled 6. Acute on Chronic Kidney Injury  Data reviewed. The patient is in very poor health. I think we should continue to try to manage her carefully with medication. Her volume status is currently ok with apparent improvement in CHF. Renal function has stabilized off diuretics. Normally I would treat her with beta blockers for nonsustained VT in the setting of cardiomyopathy. However, with her O2 dependent lung disease as well as episodes of marked bradycardia, I think it is best to avoid beta-blockade at this time. In fact I would try to avoid all AV nodal blocking agents if possible. She probably needs to remain on low-dose clonidine to manage her blood pressure. She is on multidrug therapy with amlodipine, low-dose clonidine, and high-dose hydralazine. Will follow with you but not much else to add at this point.  SignedTonny Bollman MD, [facility] 05/21/2015, 2:21 PM

## 2015-05-21 NOTE — Progress Notes (Signed)
Spoke to Dr Randa Evens and updated on interventions and results.He will see pt shortly.

## 2015-05-21 NOTE — Plan of Care (Signed)
Problem: Phase III Progression Outcomes Goal: Activity at appropriate level-compared to baseline (UP IN CHAIR FOR HEMODIALYSIS)  Outcome: Completed/Met Date Met:  05/21/15 Patient was OOB during the day. Pt. Uses the "Stedy " when OOB

## 2015-05-22 DIAGNOSIS — K5900 Constipation, unspecified: Secondary | ICD-10-CM | POA: Insufficient documentation

## 2015-05-22 DIAGNOSIS — J441 Chronic obstructive pulmonary disease with (acute) exacerbation: Secondary | ICD-10-CM | POA: Insufficient documentation

## 2015-05-22 DIAGNOSIS — N179 Acute kidney failure, unspecified: Secondary | ICD-10-CM | POA: Insufficient documentation

## 2015-05-22 DIAGNOSIS — N189 Chronic kidney disease, unspecified: Secondary | ICD-10-CM

## 2015-05-22 DIAGNOSIS — I5023 Acute on chronic systolic (congestive) heart failure: Secondary | ICD-10-CM

## 2015-05-22 LAB — GLUCOSE, CAPILLARY
Glucose-Capillary: 92 mg/dL (ref 65–99)
Glucose-Capillary: 120 mg/dL — ABNORMAL HIGH (ref 65–99)

## 2015-05-22 MED ORDER — POLYETHYLENE GLYCOL 3350 17 G PO PACK
17.0000 g | PACK | Freq: Two times a day (BID) | ORAL | Status: DC
  Administered 2015-05-22: 17 g via ORAL
  Filled 2015-05-22: qty 1

## 2015-05-22 MED ORDER — POLYETHYLENE GLYCOL 3350 17 G PO PACK: 17.0000 g | PACK | Freq: Two times a day (BID) | ORAL | Status: AC

## 2015-05-22 MED ORDER — CLONIDINE HCL 0.2 MG PO TABS: 0.1000 mg | ORAL_TABLET | Freq: Two times a day (BID) | ORAL | Status: DC

## 2015-05-22 NOTE — Discharge Summary (Signed)
Physician Discharge Summary  Addis Bennie GUY:[id number] DOB: Jun 10, 1928 DOA: 05/14/2015  PCP: No primary care provider on file.  Admit date: 05/14/2015 Discharge date: 05/22/2015  Time spent: Greater than 30 minutes  Recommendations for Outpatient Follow-up:  1. Dr. Genella Mech, PCP on 06/17/2015 at 3:30 PM. 2. Wilburt Finlay, Cardiology PA-C on 06/04/2015 at 11 AM with repeat labs (CBC & BMP). 3. Dr. Carman Ching, [facility]: follow up as needed. 4. Home Health RN, PT & Nurse's Aide 5. Follow up CXR in a couple of weeks. 6. LLL pulmonary nodule and Left renal lesion seen on CT> OP follow up by PCP as deemed necessary.  Discharge Diagnoses:  Active Problems:   CHF (congestive heart failure)   Diabetes mellitus type 2 without retinopathy   HTN (hypertension)   COPD with acute exacerbation   ARF (acute renal failure)   Acute CHF   Discharge Condition: Improved & Stable  Diet recommendation: Heart Healthy & Diabetic diet.  Filed Weights   05/19/15 0432 05/20/15 0500 05/22/15 0518  Weight: 78 kg (171 lb 15.3 oz) 78 kg (171 lb 15.3 oz) 82.2 kg (181 lb 3.5 oz)    History of present illness:  81yo with hx of combined systolic/diastolic CHF initially presented with abd pain and findings worrisome for ESBL uti. The patient was continued on IV abx per ID recs. During this course, the patient reported lower abd pain. Imaging confirmed significant stool burden. Unfortunately, the patient would not tolerate PO cathartics as well as enemas and GI consulted. Cardiology consulted on 7/27 due to concern for tachybradycardia.  Hospital Course:   SOB suspect secondary to acute CHF with copd exacerbation/ Chronic Respiratory Failure on home O2 (2.5 L/min) - Treated etiology as below - Improved  Acute on chronic systolic CHF (LVEF 30-35 percent) - Treated with IV Lasix with good effect. Lasix temporarily held d/t concern for worsening renal function - 2-D echo results as below. - TSH:  Normal - Appears euvolemic - Cardiology input appreciated. Discussed with Dr. Excell Seltzer: agree to resume torsemide at DC but hold off on ARB/ACEI d/t to concern for worsening renal failure in the context on unpredictable PO intake. These meds can be considered during OP FU. - Small to moderate left pleural effusion > ? D/t CHF Vs other etiology. Asymptomatic. Resume diuretics at DC and follow up CXR as OP in a couple of weeks and consider further evaluation if does not improve or if worsens.  CAD s/p CABG in 2012 - Serial troponins negative and 2-D echo without wall motion abnormalities - Continue aspirin - No chest pain.  Possible mild COPD exacerbation - Patient declined steroids. Treated with when necessary bronchodilators nebs - Acute exacerbation resolved.  DM2, controlled - A1c 6.2  HTN - BP poorly controlled - Continue with amlodipine and hydralazine. Clonidine dose was reduced secondary to bradycardia - ARB on hold secondary to acute kidney injury  Acute on stage IV chronic kidney disease - Baseline creatinine probably in the 1.6-1.7 range - Creatinine had peaked to 2 but has gradually improved to 1.68 - Diuretics will be resumed at DC. D/w Dr. Excell Seltzer who will arrange for labs during upcoming OP fu with Cards.  GERD - Cont PPI  UTI, possibly ESBL E coli - Recent UTI with ESBL ecoli organism - Dr. Rhona Leavens discussed with ID. Given pcn allergy hx, completed 5 days of Invanz  Abd pain likely secondary to constipation - Large stool burden seen on abd xray, increased stool amount on follow  up CT abd associated with lower quadrant pain - Minimal results with SMOG enema and pt had been refusing bowel prep recently - GI consulted. Eventually had BM's with increased dose of Miralax - Continue bowel regimen which may be adjusted as OP- this was d/w daughter on day of DC and she verbalized understanding. - Although comment made on CT report re concern for supraumbilical hernia fatty  incarceration> clinically this was not felt the case. - Patient declined consideration for OP colonoscopy- discuss further as OP.  Tachybradycardia - review of telemetry on 7/27: Patient had severe sinus bradycardia in the mid 20s on 7/26 and had periods of nonsustained VT on 7/23 and 7/24 - Clonidine dose was reduced due to sinus bradycardia - Cardiology consultation appreciated: Avoid beta blockers or any AV nodal blocking agents if possible. Continue low-dose clonidine which she was on for HTN- dose has been reduced during this hospitalization.  - Tele with SB in 50's/improved. Had a 4 bt NSVT   Chronic anemia - stable. OP follow up with repeat CBC  Left renal lesion ? Complex cyst- seen on CT - OP follow up and evaluation as deemed necessary.  LLL Pulmonary nodule, seen on CT - OP follow up as per Radiologist recommendations  Supraumbilical fat containing hernia/R para midline ventral hernia - consider OP surgical consultation. Clinically uncomplicated at this time.   Consultants:  [facility]  Cardiology  Procedures:   None  Discharge Exam:  Complaints: Had large BM on morning of DC with significant improvement/resolution of abdominal pain. No SOB/CP.  Filed Vitals:   05/22/15 0300 05/22/15 0518 05/22/15 0827 05/22/15 1227  BP: 124/48 157/67 142/78   Pulse:  77    Temp:  97.9 F (36.6 C)    TempSrc:  Oral    Resp:  20    Height:      Weight:  82.2 kg (181 lb 3.5 oz)    SpO2:  97%  94%     General: Pleasant elderly female sitting up comfortably in chair.  Cardiovascular: S1 and S2 heard, RRR. No JVD, murmurs or pedal edema. Telemetry: Sinus Brady in 50's - SR.  Respiratory: Clear to auscultation. No increased work of breathing  Abdomen: Nondistended, soft and nontender. Normal bowel sounds heard.   CNS: Alert and oriented. No focal neurological deficits   Musculoskeletal: perfused, no clubbing, no cyanosis  Discharge Instructions      Discharge  Instructions    (HEART FAILURE PATIENTS) Call MD:  Anytime you have any of the following symptoms: 1) 3 pound weight gain in 24 hours or 5 pounds in 1 week 2) shortness of breath, with or without a dry hacking cough 3) swelling in the hands, feet or stomach 4) if you have to sleep on extra pillows at night in order to breathe.    Complete by:  As directed      Call MD for:  difficulty breathing, headache or visual disturbances    Complete by:  As directed      Call MD for:  extreme fatigue    Complete by:  As directed      Call MD for:  persistant dizziness or light-headedness    Complete by:  As directed      Call MD for:  persistant nausea and vomiting    Complete by:  As directed      Call MD for:  severe uncontrolled pain    Complete by:  As directed  Call MD for:  temperature >100.4    Complete by:  As directed      Diet - low sodium heart healthy    Complete by:  As directed      Diet Carb Modified    Complete by:  As directed      Discharge instructions    Complete by:  As directed   Continue oxygen via nasal cannula at 2.5 L/m continuously as per prior home settings.     Increase activity slowly    Complete by:  As directed             Medication List    STOP taking these medications        candesartan 32 MG tablet  Commonly known as:  ATACAND     cephALEXin 500 MG capsule  Commonly known as:  KEFLEX     nitrofurantoin 100 MG capsule  Commonly known as:  MACRODANTIN     oxyCODONE-acetaminophen 5-325 MG per tablet  Commonly known as:  PERCOCET/ROXICET      TAKE these medications        amLODipine 10 MG tablet  Commonly known as:  NORVASC  Take 10 mg by mouth daily.     anastrozole 1 MG tablet  Commonly known as:  ARIMIDEX  Take 1 mg by mouth daily.     aspirin EC 81 MG tablet  Take 81 mg by mouth daily.     cloNIDine 0.2 MG tablet  Commonly known as:  CATAPRES  Take 0.5 tablets (0.1 mg total) by mouth 2 (two) times daily.     dicyclomine 20 MG  tablet  Commonly known as:  BENTYL  Take 20 mg by mouth daily as needed for spasms.     hydrALAZINE 100 MG tablet  Commonly known as:  APRESOLINE  Take 100 mg by mouth 3 (three) times daily.     ketoconazole 2 % cream  Commonly known as:  NIZORAL  Apply 1 application topically at bedtime.     levalbuterol 1.25 MG/3ML nebulizer solution  Commonly known as:  XOPENEX  Inhale 1 ampule into the lungs 4 (four) times daily.     ondansetron 4 MG disintegrating tablet  Commonly known as:  ZOFRAN ODT  Take 1 tablet (4 mg total) by mouth every 8 (eight) hours as needed for nausea or vomiting.     oxyCODONE 5 MG immediate release tablet  Commonly known as:  Oxy IR/ROXICODONE  Take 0.5 tablets by mouth 2 (two) times daily as needed. pain     pantoprazole 40 MG tablet  Commonly known as:  PROTONIX  Take 40 mg by mouth daily.     polyethylene glycol packet  Commonly known as:  MIRALAX / GLYCOLAX  Take 17 g by mouth 2 (two) times daily.     potassium chloride SA 20 MEQ tablet  Commonly known as:  K-DUR,KLOR-CON  Take 20 mEq by mouth daily.     torsemide 20 MG tablet  Commonly known as:  DEMADEX  Take 20 mg by mouth daily.     VITAMIN D-3 PO  Take 1 tablet by mouth daily.       Follow-up Information    Follow up with Judie Bonus, MD On 06/17/2015.   Specialty:  Internal Medicine   Why:  Appointment June 17, 2015 at 3:30 PM, [facility].    Contact information:   [address] [place] [address] [phone]       Follow  up with HAGER, BRYAN, PA-C On 06/04/2015.   Specialties:  Physician Assistant, Radiology, Interventional Cardiology   Why:  11:00 AM (Cardiology Follow-up). To be seen with repeat labs (CBC & BMP).   Contact information:   [address] [place] [address] [phone]       Schedule an appointment as soon as possible for a visit with EDWARDS Burna Mortimer, MD.   Specialty:  Gastroenterology   Why:  As needed, If  symptoms worsen   Contact information:   [address] [place] [address] [phone]        The results of significant diagnostics from this hospitalization (including imaging, microbiology, ancillary and laboratory) are listed below for reference.    Significant Diagnostic Studies: Ct Abdomen Pelvis Wo Contrast  05/19/2015   CLINICAL DATA:  Initial encounter for lower abdominal pain bilaterally  EXAM: CT ABDOMEN AND PELVIS WITHOUT CONTRAST  TECHNIQUE: Multidetector CT imaging of the abdomen and pelvis was performed following the standard protocol without IV contrast.  COMPARISON:  04/22/2015.  FINDINGS: Lower chest: Small to moderate right pleural effusion is slightly progressive in the interval. There is adjacent right lower lobe collapse/ consolidation. Some minimal compressive probable atelectasis is seen dependently in the left lower lobe.  Hepatobiliary: No focal abnormality in the liver on this study without intravenous contrast. No evidence for hepatomegaly. Gallbladder is contracted with tiny calcified stones evident. No intrahepatic or extrahepatic biliary dilation.  Pancreas: No focal mass lesion. No dilatation of the main duct. No intraparenchymal cyst. No peripancreatic edema.  Spleen: No splenomegaly. No focal mass lesion.  Adrenals/Urinary Tract: No adrenal nodule or mass. 19 mm low-density lesion in the interpolar right kidney is stable and likely represents a cyst. 15 mm hyper attenuating lesion is identified in the interpolar left kidney, stable. No evidence for hydroureter. Urinary bladder is unremarkable.  Stomach/Bowel: Stomach is nondistended. No gastric wall thickening. No evidence of outlet obstruction. Duodenum is normally positioned as is the ligament of Treitz. No small bowel wall thickening. No small bowel dilatation. Prominent stool volume noted throughout the colon up to the level of the sigmoid segment with no substantial stool from the mid sigmoid  colon to the rectum. Although somewhat difficult to assess without intravenous contrast material, there is no evidence for colonic wall thickening.  Vascular/Lymphatic: There is abdominal aortic atherosclerosis without aneurysm. No lymphadenopathy in the abdomen or pelvis.  Reproductive: Uterus is surgically absent.  No adnexal mass.  Other: No intraperitoneal free fluid.  Musculoskeletal: There are 2 right para midline ventral hernias in the region of the umbilicus. Both contain only omental fat. The more cranial of the 2 which is just cranial to the umbilicus has some edema/fluid within the hernia sac as before a degree of fatty incarceration could have this appearance. There is no evidence for bowel loop in either hernia sac. Bones are diffusely demineralized without worrisome lytic or sclerotic osseous abnormality. Degenerative changes are seen in the lumbar spine, most advanced at L3-4 and L4-5.  IMPRESSION: 1. Interval increase in colonic stool volume. Otherwise stable exam. 2. Stable appearance of the supraumbilical fat containing hernia with associated fluid/edema. Again, this raises the question of fatty incarceration. A second right para midline ventral hernia is also evident. There is no evidence for bowel loops in either hernia sac. 3. Stable appearance of the hyperdense left renal lesion, likely a complex cyst although neoplasm could have this appearance. 4. The thickening of the inferior left  rectus muscle described previously is less evident on today's study. 5. Abdominal aortic atherosclerosis. 6. Slight increase in the small to moderate left pleural effusion. 7. Cholelithiasis.   Electronically Signed   By: Kennith Center M.D.   On: 05/19/2015 17:39   Ct Abdomen Pelvis Wo Contrast  04/22/2015   CLINICAL DATA:  Left lower quadrant abdominal pain. Urinary tract infection.  EXAM: CT ABDOMEN AND PELVIS WITHOUT CONTRAST  TECHNIQUE: Multidetector CT imaging of the abdomen and pelvis was performed  following the standard protocol without IV contrast.  COMPARISON:  None.  FINDINGS: Lower chest: Coronary atherosclerosis. Mild aortic and mitral valve calcification. Low-density blood pool. Mild cardiomegaly.  Small to moderate right pleural effusion, nonspecific for transudative versus exudative etiology.  6 mm left lower lobe pulmonary nodule, image 3 series 3.  Hepatobiliary: Small densities in the contracted gallbladder, suspicious for gallstones.  Pancreas: Unremarkable  Spleen: Unremarkable  Adrenals/Urinary Tract: Right kidney upper pole exophytic hypodense 1.6 cm lesion, internal density -6 Hounsfield units. Left anterior mid kidney 1.6 hyper dense lesion, internal density 56 Hounsfield units. No hydronephrosis or hydroureter. Vascular calcifications in both renal hila.  Stomach/Bowel: Sigmoid diverticulosis.  Vascular/Lymphatic: Dense aortic and branch vessel atherosclerotic vascular disease. Left common iliac node 1.0 cm in short axis, borderline enlarged.  Reproductive: Uterus absent.  Ovaries not well seen.  Other: No supplemental non-categorized findings.  Musculoskeletal: Ventral supraumbilical hernia measuring 6.1 by 6.3 by 2.9 cm contains fluid and fat density. No herniated bowel. There is also a right paracentral ventral hernia at the level of the umbilicus containing only adipose tissue.  Lumbar spondylosis and degenerative disc disease with grade 1 retrolisthesis at L3-4 and grade 1 anterolisthesis at L4-5. Endplate irregularity is likely due to Schmorl' s nodes and degenerative endplate findings at the L3-4 level. Left lateral recess foraminal disc protrusion at L2-3; left paracentral disc protrusion at L3-4 ; extensive facet arthropathy at L4-5, with suspected impingement at each of these levels.  IMPRESSION: 1. Supraumbilical hernia contains adipose tissue and fluid, and could represent inflamed or ischemic herniated omental adipose tissue. Just below this there is another right paracentral  ventral hernia containing only adipose tissue. 2. Abnormal fluid/thickening along the posterior margin of the left inferior rectus abdominus muscle, possibly postoperative although a small hematoma in this vicinity is not excluded. 3. Hyperdense left anterior mid kidney lesion, probably a complex cyst although mass is not excluded given the lack of IV contrast. 4. Extensive atherosclerosis including the coronary arteries, with aortic and mitral valve calcification as well. 5. Low-density blood pool suggests anemia. 6. Mild cardiomegaly. 7. Small to moderate right pleural effusion. 8. 6 mm left lower lobe pulmonary nodule. If the patient is at high risk for bronchogenic carcinoma, follow-up chest CT at 6-12 months is recommended. If the patient is at low risk for bronchogenic carcinoma, follow-up chest CT at 12 months is recommended. This recommendation follows the consensus statement: Guidelines for Management of Small Pulmonary Nodules Detected on CT Scans: A Statement from the Fleischner Society as published in Radiology 2005;237:395-400. 9. Sigmoid diverticulosis without active diverticulitis. 10. Lumbar spondylosis and degenerative disc disease causing lower lumbar impingement. 11. Cholelithiasis   Electronically Signed   By: Gaylyn Rong M.D.   On: 04/22/2015 16:37   Dg Chest 2 View  05/14/2015   CLINICAL DATA:  shortness of breath.  Recent right pleural effusion.  EXAM: CHEST  2 VIEW  COMPARISON:  04/27/2015, 05/08/2015.  FINDINGS: prior median sternotomy. Small pleural effusions noted  bilaterally, larger on the right. There is associated bibasilar atelectasis. Upper lungs remain clear. No pneumothorax. Atherosclerosis of the aorta. Degenerative changes of the left shoulder and spine. No compression fracture  IMPRESSION: Cardiomegaly with persistent small effusions, large on the right. Associated bibasilar atelectasis. Stable postop changes.   Electronically Signed   By: Judie Petit.  Shick M.D.   On:  05/14/2015 14:35   Dg Chest 2 View  05/08/2015   CLINICAL DATA:  Shortness of breath, recent diagnosis of pneumonia  EXAM: CHEST - 2 VIEW  COMPARISON:  04/30/2015  FINDINGS: Cardiac shadow is again enlarged. Postsurgical changes are again seen. Bibasilar changes are again noted but improved slightly in the interval from the prior exam persistent right effusion is seen. No acute bony abnormality is noted.  IMPRESSION: Slight improved aeration in the bases bilaterally   Electronically Signed   By: Alcide Clever M.D.   On: 05/08/2015 16:21   Dg Chest 2 View  04/30/2015   CLINICAL DATA:  Shortness of breath beginning at 5:00 a.m. today. History of COPD.  EXAM: CHEST  2 VIEW  COMPARISON:  Single view of the chest 04/27/2015. CT abdomen and pelvis 04/22/2015 also reviewed.  FINDINGS: Right pleural effusion and basilar airspace disease to do not appear markedly changed. Patchy left basilar airspace disease is also stable in appearance. There is no pulmonary edema. The patient is status post CABG. Heart size is enlarged.  IMPRESSION: No marked change in right greater than left basilar airspace disease and a small right effusion. No new abnormality.   Electronically Signed   By: Drusilla Kanner M.D.   On: 04/30/2015 16:31   Dg Abd Acute W/chest  04/27/2015   CLINICAL DATA:  Abdominal pain and nausea. Constipation. Shortness of breath.  EXAM: DG ABDOMEN ACUTE W/ 1V CHEST  COMPARISON:  CT scan dated 04/22/2015  FINDINGS: There is a small right pleural effusion. There is cardiomegaly. Pulmonary vascularity is normal.  No free air in the abdomen. Bowel gas pattern is normal. Contrast from previous CT scan is now in the distal colon. Extensive vascular calcification in the abdomen. Diffuse degenerative changes in the lumbar spine.  IMPRESSION: No acute abnormalities of the abdomen. Right pleural effusion, unchanged since the prior exam.   Electronically Signed   By: Francene Boyers M.D.   On: 04/27/2015 16:43   Dg Abd  Portable 1v  05/18/2015   CLINICAL DATA:  Three day history of constipation.  EXAM: PORTABLE ABDOMEN - 1 VIEW  COMPARISON:  Acute abdomen series 04/27/1015. CT abdomen pelvis 04/22/2015.  FINDINGS: Bowel gas pattern unremarkable without evidence of obstruction or significant ileus. Large stool burden in the visualized colon. Aortoiliac atherosclerosis without aneurysm. Adjacent large phleboliths in the right ovarian vein projected next to the L4 vertebral body. Degenerative changes involve the lumbar spine.  IMPRESSION: No acute abdominal abnormality.  Large colonic stool burden.   Electronically Signed   By: Hulan Saas M.D.   On: 05/18/2015 13:35    Microbiology: Recent Results (from the past 240 hour(s))  Urine culture     Status: None   Collection Time: 05/14/15  5:26 PM  Result Value Ref Range Status   Specimen Description URINE, CLEAN CATCH  Final   Special Requests NONE  Final   Culture   Final    MULTIPLE SPECIES PRESENT, SUGGEST RECOLLECTION IF CLINICALLY INDICATED Performed at [facility]    Report Status 05/15/2015 FINAL  Final     Labs: Basic Metabolic Panel:  Recent Labs Lab 05/17/15 0513 05/18/15 0520 05/19/15 0549 05/20/15 0459 05/21/15 0840  NA 140 142 140 143 144  K 4.2 4.3 3.7 3.9 3.9  CL 102 105 105 107 108  CO2 29 27 28 27 26   GLUCOSE 133* 113* 104* 131* 110*  BUN 52* 51* 44* 49* 44*  CREATININE 2.00* 1.78* 1.64* 1.72* 1.68*  CALCIUM 8.8* 8.9 8.7* 9.0 8.8*   Liver Function Tests: No results for input(s): AST, ALT, ALKPHOS, BILITOT, PROT, ALBUMIN in the last 168 hours. No results for input(s): LIPASE, AMYLASE in the last 168 hours. No results for input(s): AMMONIA in the last 168 hours. CBC:  Recent Labs Lab 05/16/15 0417 05/17/15 0513 05/20/15 0459 05/21/15 0500  WBC 5.8 5.5 5.9 8.0  HGB 8.6* 9.4* 9.5* 8.5*  HCT 27.9* 30.0* 30.7* 28.2*  MCV 87.5 88.0 88.0 87.3  PLT 183 203 218 185   Cardiac Enzymes: No results for input(s):  CKTOTAL, CKMB, CKMBINDEX, TROPONINI in the last 168 hours. BNP: BNP (last 3 results)  Recent Labs  05/14/15 1454  BNP 1487.4*    ProBNP (last 3 results) No results for input(s): PROBNP in the last 8760 hours.  CBG:  Recent Labs Lab 05/21/15 1211 05/21/15 1547 05/21/15 2151 05/22/15 0735 05/22/15 1213  GLUCAP 106* 115* 113* 92 120*        Signed:  Aidah Forquer, MD, FACP, FHM. [facility] Pager [phone]  If 7PM-7AM, please contact night-coverage [email or web address] Password [facility] 05/22/2015, 2:34 PM

## 2015-05-22 NOTE — Discharge Instructions (Signed)
Heart Failure °Heart failure is a condition in which the heart has trouble pumping blood. This means your heart does not pump blood efficiently for your body to work well. In some cases of heart failure, fluid may back up into your lungs or you may have swelling (edema) in your lower legs. Heart failure is usually a long-term (chronic) condition. It is important for you to take good care of yourself and follow your health care provider's treatment plan. °CAUSES  °Some health conditions can cause heart failure. Those health conditions include: °· High blood pressure (hypertension). Hypertension causes the heart muscle to work harder than normal. When pressure in the blood vessels is high, the heart needs to pump (contract) with more force in order to circulate blood throughout the body. High blood pressure eventually causes the heart to become stiff and weak. °· Coronary artery disease (CAD). CAD is the buildup of cholesterol and fat (plaque) in the arteries of the heart. The blockage in the arteries deprives the heart muscle of oxygen and blood. This can cause chest pain and may lead to a heart attack. High blood pressure can also contribute to CAD. °· Heart attack (myocardial infarction). A heart attack occurs when one or more arteries in the heart become blocked. The loss of oxygen damages the muscle tissue of the heart. When this happens, part of the heart muscle dies. The injured tissue does not contract as well and weakens the heart's ability to pump blood. °· Abnormal heart valves. When the heart valves do not open and close properly, it can cause heart failure. This makes the heart muscle pump harder to keep the blood flowing. °· Heart muscle disease (cardiomyopathy or myocarditis). Heart muscle disease is damage to the heart muscle from a variety of causes. These can include drug or alcohol abuse, infections, or unknown reasons. These can increase the risk of heart failure. °· Lung disease. Lung disease  makes the heart work harder because the lungs do not work properly. This can cause a strain on the heart, leading it to fail. °· Diabetes. Diabetes increases the risk of heart failure. High blood sugar contributes to high fat (lipid) levels in the blood. Diabetes can also cause slow damage to tiny blood vessels that carry important nutrients to the heart muscle. When the heart does not get enough oxygen and food, it can cause the heart to become weak and stiff. This leads to a heart that does not contract efficiently. °· Other conditions can contribute to heart failure. These include abnormal heart rhythms, thyroid problems, and low blood counts (anemia). °Certain unhealthy behaviors can increase the risk of heart failure, including: °· Being overweight. °· Smoking or chewing tobacco. °· Eating foods high in fat and cholesterol. °· Abusing illicit drugs or alcohol. °· Lacking physical activity. °SYMPTOMS  °Heart failure symptoms may vary and can be hard to detect. Symptoms may include: °· Shortness of breath with activity, such as climbing stairs. °· Persistent cough. °· Swelling of the feet, ankles, legs, or abdomen. °· Unexplained weight gain. °· Difficulty breathing when lying flat (orthopnea). °· Waking from sleep because of the need to sit up and get more air. °· Rapid heartbeat. °· Fatigue and loss of energy. °· Feeling light-headed, dizzy, or close to fainting. °· Loss of appetite. °· Nausea. °· Increased urination during the night (nocturia). °DIAGNOSIS  °A diagnosis of heart failure is based on your history, symptoms, physical examination, and diagnostic tests. Diagnostic tests for heart failure may include: °·   Echocardiography. °· Electrocardiography. °· Chest X-ray. °· Blood tests. °· Exercise stress test. °· Cardiac angiography. °· Radionuclide scans. °TREATMENT  °Treatment is aimed at managing the symptoms of heart failure. Medicines, behavioral changes, or surgical intervention may be necessary to  treat heart failure. °· Medicines to help treat heart failure may include: °¨ Angiotensin-converting enzyme (ACE) inhibitors. This type of medicine blocks the effects of a blood protein called angiotensin-converting enzyme. ACE inhibitors relax (dilate) the blood vessels and help lower blood pressure. °¨ Angiotensin receptor blockers (ARBs). This type of medicine blocks the actions of a blood protein called angiotensin. Angiotensin receptor blockers dilate the blood vessels and help lower blood pressure. °¨ Water pills (diuretics). Diuretics cause the kidneys to remove salt and water from the blood. The extra fluid is removed through urination. This loss of extra fluid lowers the volume of blood the heart pumps. °¨ Beta blockers. These prevent the heart from beating too fast and improve heart muscle strength. °¨ Digitalis. This increases the force of the heartbeat. °· Healthy behavior changes include: °¨ Obtaining and maintaining a healthy weight. °¨ Stopping smoking or chewing tobacco. °¨ Eating heart-healthy foods. °¨ Limiting or avoiding alcohol. °¨ Stopping illicit drug use. °¨ Physical activity as directed by your health care provider. °· Surgical treatment for heart failure may include: °¨ A procedure to open blocked arteries, repair damaged heart valves, or remove damaged heart muscle tissue. °¨ A pacemaker to improve heart muscle function and control certain abnormal heart rhythms. °¨ An internal cardioverter defibrillator to treat certain serious abnormal heart rhythms. °¨ A left ventricular assist device (LVAD) to assist the pumping ability of the heart. °HOME CARE INSTRUCTIONS  °· Take medicines only as directed by your health care provider. Medicines are important in reducing the workload of your heart, slowing the progression of heart failure, and improving your symptoms. °¨ Do not stop taking your medicine unless directed by your health care provider. °¨ Do not skip any dose of medicine. °¨ Refill your  prescriptions before you run out of medicine. Your medicines are needed every day. °· Engage in moderate physical activity if directed by your health care provider. Moderate physical activity can benefit some people. The elderly and people with severe heart failure should consult with a health care provider for physical activity recommendations. °· Eat heart-healthy foods. Food choices should be free of trans fat and low in saturated fat, cholesterol, and salt (sodium). Healthy choices include fresh or frozen fruits and vegetables, fish, lean meats, legumes, fat-free or low-fat dairy products, and whole grain or high fiber foods. Talk to a dietitian to learn more about heart-healthy foods. °· Limit sodium if directed by your health care provider. Sodium restriction may reduce symptoms of heart failure in some people. Talk to a dietitian to learn more about heart-healthy seasonings. °· Use healthy cooking methods. Healthy cooking methods include roasting, grilling, broiling, baking, poaching, steaming, or stir-frying. Talk to a dietitian to learn more about healthy cooking methods. °· Limit fluids if directed by your health care provider. Fluid restriction may reduce symptoms of heart failure in some people. °· Weigh yourself every day. Daily weights are important in the early recognition of excess fluid. You should weigh yourself every morning after you urinate and before you eat breakfast. Wear the same amount of clothing each time you weigh yourself. Record your daily weight. Provide your health care provider with your weight record. °· Monitor and record your blood pressure if directed by your health care   provider. °· Check your pulse if directed by your health care provider. °· Lose weight if directed by your health care provider. Weight loss may reduce symptoms of heart failure in some people. °· Stop smoking or chewing tobacco. Nicotine makes your heart work harder by causing your blood vessels to constrict.  Do not use nicotine gum or patches before talking to your health care provider. °· Keep all follow-up visits as directed by your health care provider. This is important. °· Limit alcohol intake to no more than 1 drink per day for nonpregnant women and 2 drinks per day for men. One drink equals 12 ounces of beer, 5 ounces of wine, or 1½ ounces of hard liquor. Drinking more than that is harmful to your heart. Tell your health care provider if you drink alcohol several times a week. Talk with your health care provider about whether alcohol is safe for you. If your heart has already been damaged by alcohol or you have severe heart failure, drinking alcohol should be stopped completely. °· Stop illicit drug use. °· Stay up-to-date with immunizations. It is especially important to prevent respiratory infections through current pneumococcal and influenza immunizations. °· Manage other health conditions such as hypertension, diabetes, thyroid disease, or abnormal heart rhythms as directed by your health care provider. °· Learn to manage stress. °· Plan rest periods when fatigued. °· Learn strategies to manage high temperatures. If the weather is extremely hot: °¨ Avoid vigorous physical activity. °¨ Use air conditioning or fans or seek a cooler location. °¨ Avoid caffeine and alcohol. °¨ Wear loose-fitting, lightweight, and light-colored clothing. °· Learn strategies to manage cold temperatures. If the weather is extremely cold: °¨ Avoid vigorous physical activity. °¨ Layer clothes. °¨ Wear mittens or gloves, a hat, and a scarf when going outside. °¨ Avoid alcohol. °· Obtain ongoing education and support as needed. °· Participate in or seek rehabilitation as needed to maintain or improve independence and quality of life. °SEEK MEDICAL CARE IF:  °· Your weight increases by 03 lb/1.4 kg in 1 day or 05 lb/2.3 kg in a week. °· You have increasing shortness of breath that is unusual for you. °· You are unable to participate in  your usual physical activities. °· You tire easily. °· You cough more than normal, especially with physical activity. °· You have any or more swelling in areas such as your hands, feet, ankles, or abdomen. °· You are unable to sleep because it is hard to breathe. °· You feel like your heart is beating fast (palpitations). °· You become dizzy or light-headed upon standing up. °SEEK IMMEDIATE MEDICAL CARE IF:  °· You have difficulty breathing. °· There is a change in mental status such as decreased alertness or difficulty with concentration. °· You have a pain or discomfort in your chest. °· You have an episode of fainting (syncope). °MAKE SURE YOU:  °· Understand these instructions. °· Will watch your condition. °· Will get help right away if you are not doing well or get worse. °Document Released: 10/11/2005 Document Revised: 02/25/2014 Document Reviewed: 11/10/2012 °[employer]® Patient Information ©2015 [employer]. This information is not intended to replace advice given to you by your health care provider. Make sure you discuss any questions you have with your health care provider. ° ° °Constipation °Constipation is when a person has fewer than three bowel movements a week, has difficulty having a bowel movement, or has stools that are dry, hard, or larger than normal. As people grow older,   constipation is more common. If you try to fix constipation with medicines that make you have a bowel movement (laxatives), the problem may get worse. Long-term laxative use may cause the muscles of the colon to become weak. A low-fiber diet, not taking in enough fluids, and taking certain medicines may make constipation worse.  °CAUSES  °· Certain medicines, such as antidepressants, pain medicine, iron supplements, antacids, and water pills.   °· Certain diseases, such as diabetes, irritable bowel syndrome (IBS), thyroid disease, or depression.   °· Not drinking enough water.   °· Not eating enough fiber-rich foods.    °· Stress or travel.   °· Lack of physical activity or exercise.   °· Ignoring the urge to have a bowel movement.   °· Using laxatives too much.   °SIGNS AND SYMPTOMS  °· Having fewer than three bowel movements a week.   °· Straining to have a bowel movement.   °· Having stools that are hard, dry, or larger than normal.   °· Feeling full or bloated.   °· Pain in the lower abdomen.   °· Not feeling relief after having a bowel movement.   °DIAGNOSIS  °Your health care provider will take a medical history and perform a physical exam. Further testing may be done for severe constipation. Some tests may include: °· A barium enema X-ray to examine your rectum, colon, and, sometimes, your small intestine.   °· A sigmoidoscopy to examine your lower colon.   °· A colonoscopy to examine your entire colon. °TREATMENT  °Treatment will depend on the severity of your constipation and what is causing it. Some dietary treatments include drinking more fluids and eating more fiber-rich foods. Lifestyle treatments may include regular exercise. If these diet and lifestyle recommendations do not help, your health care provider may recommend taking over-the-counter laxative medicines to help you have bowel movements. Prescription medicines may be prescribed if over-the-counter medicines do not work.  °HOME CARE INSTRUCTIONS  °· Eat foods that have a lot of fiber, such as fruits, vegetables, whole grains, and beans. °· Limit foods high in fat and processed sugars, such as french fries, hamburgers, cookies, candies, and soda.   °· A fiber supplement may be added to your diet if you cannot get enough fiber from foods.   °· Drink enough fluids to keep your urine clear or pale yellow.   °· Exercise regularly or as directed by your health care provider.   °· Go to the restroom when you have the urge to go. Do not hold it.   °· Only take over-the-counter or prescription medicines as directed by your health care provider. Do not take other  medicines for constipation without talking to your health care provider first.   °SEEK IMMEDIATE MEDICAL CARE IF:  °· You have bright red blood in your stool.   °· Your constipation lasts for more than 4 days or gets worse.   °· You have abdominal or rectal pain.   °· You have thin, pencil-like stools.   °· You have unexplained weight loss. °MAKE SURE YOU:  °· Understand these instructions. °· Will watch your condition. °· Will get help right away if you are not doing well or get worse. °Document Released: 07/09/2004 Document Revised: 10/16/2013 Document Reviewed: 07/23/2013 °ExitCare® Patient Information ©2015 [employer]. This information is not intended to replace advice given to you by your health care provider. Make sure you discuss any questions you have with your health care provider. ° °

## 2015-05-22 NOTE — Progress Notes (Signed)
[facility] PROGRESS NOTE Subjective Pt reports a "wonderfull" BM. On bedside toilet now, abdominal pain gone.  Objective: Vital signs in last 24 hours: Temp:  [97.5 F (36.4 C)-98.9 F (37.2 C)] 97.9 F (36.6 C) (07/28 0518) Pulse Rate:  [64-77] 77 (07/28 0518) Resp:  [18-20] 20 (07/28 0518) BP: (124-186)/(46-74) 157/67 mmHg (07/28 0518) SpO2:  [95 %-98 %] 97 % (07/28 0518) Weight:  [82.2 kg (181 lb 3.5 oz)] 82.2 kg (181 lb 3.5 oz) (07/28 0518) Last BM Date:  (unknown)  Intake/Output from previous day: 07/27 0701 - 07/28 0700 In: 2043 [P.O.:480; I.V.:1563] Out: -  Intake/Output this shift:    PE: General--on toilet  Abdomen--obese, nontender Lab Results:  Recent Labs  05/20/15 0459 05/21/15 0500  WBC 5.9 8.0  HGB 9.5* 8.5*  HCT 30.7* 28.2*  PLT 218 185   BMET  Recent Labs  05/20/15 0459 05/21/15 0840  NA 143 144  K 3.9 3.9  CL 107 108  CO2 27 26  CREATININE 1.72* 1.68*   LFT No results for input(s): PROT, AST, ALT, ALKPHOS, BILITOT, BILIDIR, IBILI in the last 72 hours. PT/INR No results for input(s): LABPROT, INR in the last 72 hours. PANCREAS No results for input(s): LIPASE in the last 72 hours.       Studies/Results: No results found.  Medications: I have reviewed the patient's current medications.  Assessment/Plan: 1. Abdominal Pain. Appears resolved with treatment of constipation with Miralax. Will advance diet and cut back on Miralax, would adjust to achieve soft regular BMs Please call us back if needed.   Emsley Custer JR,Loeta Herst L 05/22/2015, 8:02 AM  Pager: [phone] If no answer or after hours call [phone]

## 2015-05-22 NOTE — Progress Notes (Signed)
Patient Profile: 81 year old woman with chronic systolic heart failure, coronary artery disease status post CABG, chronic respiratory failure on home oxygen, was hospitalized with shortness of breath. She was felt to have COPD exacerbation as well as some evidence of congestive heart failure with elevated BNP and bilateral pleural effusions. Also with episodes of bradycardia on telemetry. Her heart rate has been as low as 30 bpm at times. She has also been seen to have frequent PVCs, both isolated and consecutive. She has also had what appears to be atrial tachycardia with aberrancy.  Subjective: No complaints. Denies CP. No dizziness, syncope/ near syncope or palpitations.   Objective: Vital signs in last 24 hours: Temp:  [97.5 F (36.4 C)-98.9 F (37.2 C)] 97.9 F (36.6 C) (07/28 0518) Pulse Rate:  [64-77] 77 (07/28 0518) Resp:  [18-20] 20 (07/28 0518) BP: (124-186)/(46-74) 157/67 mmHg (07/28 0518) SpO2:  [95 %-98 %] 97 % (07/28 0518) Weight:  [181 lb 3.5 oz (82.2 kg)] 181 lb 3.5 oz (82.2 kg) (07/28 0518) Last BM Date:  (unknown)  Intake/Output from previous day: 07/27 0701 - 07/28 0700 In: 2043 [P.O.:480; I.V.:1563] Out: -  Intake/Output this shift:    Medications Current Facility-Administered Medications  Medication Dose Route Frequency Provider Last Rate Last Dose  . acetaminophen (TYLENOL) tablet 650 mg  650 mg Oral Q6H PRN Jerald Kief, MD   650 mg at 05/20/15 1808   Or  . acetaminophen (TYLENOL) suppository 650 mg  650 mg Rectal Q6H PRN Jerald Kief, MD      . amLODipine (NORVASC) tablet 10 mg  10 mg Oral Daily Jerald Kief, MD   10 mg at 05/21/15 0949  . anastrozole (ARIMIDEX) tablet 1 mg  1 mg Oral Daily Jerald Kief, MD   1 mg at 05/21/15 0948  . aspirin EC tablet 81 mg  81 mg Oral Daily Jerald Kief, MD   81 mg at 05/21/15 0948  . cloNIDine (CATAPRES) tablet 0.1 mg  0.1 mg Oral BID Elease Etienne, MD   0.1 mg at 05/21/15 2330  . dicyclomine (BENTYL)  tablet 20 mg  20 mg Oral Daily PRN Jerald Kief, MD   20 mg at 05/21/15 2233  . heparin injection 5,000 Units  5,000 Units Subcutaneous 3 times per day Jerald Kief, MD   5,000 Units at 05/21/15 1328  . hydrALAZINE (APRESOLINE) injection 5 mg  5 mg Intravenous Q4H PRN Jerald Kief, MD      . hydrALAZINE (APRESOLINE) tablet 100 mg  100 mg Oral TID Jerald Kief, MD   100 mg at 05/21/15 1635  . insulin aspart (novoLOG) injection 0-15 Units  0-15 Units Subcutaneous TID WC Jerald Kief, MD   2 Units at 05/20/15 1229  . insulin aspart (novoLOG) injection 0-5 Units  0-5 Units Subcutaneous QHS Jerald Kief, MD   Stopped at 05/15/15 2200  . ipratropium (ATROVENT) nebulizer solution 0.5 mg  0.5 mg Nebulization TID Jerald Kief, MD   0.5 mg at 05/21/15 1953  . levalbuterol (XOPENEX) nebulizer solution 0.63 mg  0.63 mg Nebulization TID Jerald Kief, MD   0.63 mg at 05/21/15 1953  . levalbuterol (XOPENEX) nebulizer solution 0.63 mg  0.63 mg Nebulization Q6H PRN Jerald Kief, MD      . morphine 2 MG/ML injection 1 mg  1 mg Intravenous Q4H PRN Elease Etienne, MD      . ondansetron [facility]) tablet 4 mg  4 mg Oral Q6H PRN Jerald Kief, MD   4 mg at 05/21/15 0352   Or  . ondansetron [facility]) injection 4 mg  4 mg Intravenous Q6H PRN Jerald Kief, MD   4 mg at 05/15/15 0825  . oxyCODONE (Oxy IR/ROXICODONE) immediate release tablet 2.5 mg  2.5 mg Oral BID PRN Elease Etienne, MD      . pantoprazole (PROTONIX) EC tablet 40 mg  40 mg Oral Daily Jerald Kief, MD   40 mg at 05/21/15 0956  . polyethylene glycol (MIRALAX / GLYCOLAX) packet 17 g  17 g Oral QID Carman Ching, MD   17 g at 05/21/15 2330  . sodium chloride 0.9 % injection 3 mL  3 mL Intravenous Q12H Jerald Kief, MD   3 mL at 05/21/15 2200    PE: General appearance: alert, cooperative and no distress Neck: no carotid bruit and no JVD Lungs: clear to auscultation bilaterally Heart: regular rate and rhythm and 2/6 SM along  LSB Abdomen: soft, non-tender; bowel sounds normal; no masses,  no organomegaly Extremities: trace LEE R>L Pulses: 2+ and symmetric Skin: warm and dry Neurologic: Grossly normal  Lab Results:   Recent Labs  05/20/15 0459 05/21/15 0500  WBC 5.9 8.0  HGB 9.5* 8.5*  HCT 30.7* 28.2*  PLT 218 185   BMET  Recent Labs  05/20/15 0459 05/21/15 0840  NA 143 144  K 3.9 3.9  CL 107 108  CO2 27 26  GLUCOSE 131* 110*  BUN 49* 44*  CREATININE 1.72* 1.68*  CALCIUM 9.0 8.8*    Assessment/Plan  Active Problems:   CHF (congestive heart failure)   Diabetes mellitus type 2 without retinopathy   HTN (hypertension)   COPD with acute exacerbation   ARF (acute renal failure)   Acute CHF   1. Bradycardia: no further significant bradycardia captured on telemetry. HR currently in the 90s. Given recent h/o rates as low as 30 bpm, would avoid any further use of AVN blocking agents. She remains on low dose clonidine for BP and appears to be tolerating this well.   2. NSVT: 4 beat run of NSVT on telemetry over the past 12 hrs otherwise only isolated PVCs and occasional couplets. As mentioned above would avoid use of BB and CCB.   3. CHF: with trace LEE R>L but volume otherwise appears stable. EF is 30-35% and Scr appears to be at her baseline. Will need PO Lasix as an OP along with low sodium diet. Encourage daily weights.   4: CAD: h/o CABG in [place]. Denies CP.   5. HTN: remains moderately elevated. Currently on amlodipine, low dose clonidine and high dose hydralazine.  Clonidine was reduced given her bradycardia. Her baseline SCr is ~1.6. Can consider adding back low dose ACE/ARB for additional coverage.   6. CKD: SCr at baseline ~1.6.   Dispo: patient appears stable from a cardiac standpoint. However Dr. Excell Seltzer to assess prior to discharge. She recently moved to [facility] from [facility] and has not established care with a cardiologist here. She has h/o CAD with CAGB and systolic CHF. She will need to be  followed regularly. Will establish a new patient appointment in our office.    LOS: 8 days    Brittainy M. Delmer Islam 05/22/2015 8:01 AM  Patient seen, examined. Available data reviewed. Agree with findings, assessment, and plan as outlined by Robbie Lis, PA-C. The patient's exam is unchanged. Her lung fields are clear and her heart is  regular rate and rhythm. There is no peripheral edema. We will arrange outpatient follow-up. I have reviewed her medications and agree with continuing her current program. I have discussed her case with Dr. Waymon Amato and agree that the risk/benefit of an ARB/ACE is unfavorable as her nutritional/hydration status is poor and risk of AKI is high.  Tonny Bollman, M.D. 05/22/2015 2:42 PM

## 2015-05-22 NOTE — Progress Notes (Signed)
D/C instructions given and reviewed.skin [facility]DI

## 2015-05-22 NOTE — Care Management Note (Signed)
Case Management Note  Patient Details  Name: Leslie Parrish MRN: [id number] Date of Birth: [birth year]  Subjective/Objective:                    Action/Plan:  Plan to discharge today home with daughter and [facility]   Expected Discharge Date:   (unknown)               Expected Discharge Plan:  Home w Home Health Services  In-House Referral:     Discharge planning Services  CM Consult, Follow-up appt scheduled  Post Acute Care Choice:    Choice offered to:  Patient  DME Arranged:    DME Agency:     HH Arranged:  RN, PT, Nurse's Aide HH Agency:  [facility]  Status of Service:  In process, will continue to follow  Medicare Important Message Given:  Yes-third notification given Date Medicare IM Given:    Medicare IM give by:    Date Additional Medicare IM Given:    Additional Medicare Important Message give by:     If discussed at Long Length of Stay Meetings, dates discussed:    Additional CommentsGeni Bers, RN 05/22/2015, 12:07 PM

## 2015-06-03 ENCOUNTER — Telehealth: Payer: Self-pay | Admitting: Physician Assistant

## 2015-06-03 DIAGNOSIS — J441 Chronic obstructive pulmonary disease with (acute) exacerbation: Secondary | ICD-10-CM

## 2015-06-03 DIAGNOSIS — I5023 Acute on chronic systolic (congestive) heart failure: Secondary | ICD-10-CM

## 2015-06-03 DIAGNOSIS — N189 Chronic kidney disease, unspecified: Secondary | ICD-10-CM

## 2015-06-03 DIAGNOSIS — N179 Acute kidney failure, unspecified: Secondary | ICD-10-CM

## 2015-06-03 NOTE — Telephone Encounter (Signed)
Contacted the pt about concerns mentioned below, and per the pt she moved here from [place] and was recently admitted to [facility] for exacerbation of CHF.  Pt has an extensive cardiac and respiratory history.  Per the pt she is to have an est post hospital follow-up appt scheduled for tomorrow 8/10 with Wilburt Finlay PA-C at our office, for recent hospital stay.  Per the pt she refuses to see a PA or NP, only a Cardiologist, for she states that in [place] that's all she has ever seen.  Pt states that she wishes to cancel the pt tomorrow and schedule an appt with Dr Excell Seltzer, who consulted on her in the hospital.  Informed the pt that Dr Excell Seltzer is out of the office this week, and his scheduled is booked all the way up to November, per our schedulers.  Highly advised the pt to come in for her scheduled appt with [facility] tomorrow, for she needs to have further assessment of her cardiac status, and have repeat labs done.  Informed the pt that canceling this appt, will only delay her care and can potentially be very unsafe.  Informed the pt that our PA's and NP's are very thorough in our office, and they work hand-in-hand with our Cardiologist.  Per the pt she states she will think about this, but would like for me to give her daughter Malachi Bonds a call back in 10 mins to discuss exactly what I just told the pt.  Informed the pt that I will most definitely contact her daughter as requested, and follow-up with her thereafter.  Pt verbalized understanding and agrees with this plan.

## 2015-06-03 NOTE — Telephone Encounter (Signed)
Contacted the daughter Malachi Bonds, as pt requested, to advise her the urgency and importance of the pt seeing Wilburt Finlay PA-C tomorrow 8/10 for post hospital follow-up of CHF.  Reiterated to Malachi Bonds that waiting on an appt with her General Cardiologist would be a delay in pt care and can potentially be unsafe for the pt.  Per Malachi Bonds, the biggest issue with the pt is having her come in at 11 am, verses coming in at a later time.  Malachi Bonds asked if there was any way that her appt could be arranged at a later time with Wilburt Finlay PA-C.  Informed Malachi Bonds that we can schedule her to come in to see Judie Grieve tomorrow at 2:30 pm at [facility] location.  Informed Malachi Bonds that we will also do labs on the pt while she is in the hospital, for this was endorsed on her discharge paperwork from the hospital for the pt to have a repeat cbc w diff and bmp.  Informed Malachi Bonds that I will notify her HHN of this as well.  Malachi Bonds and pt both verbalized understanding, agrees with this plan, and gracious for all the assistance provided.

## 2015-06-03 NOTE — Telephone Encounter (Signed)
Left detailed message on [facility] Cat[facility]n confirmed VM that pt has agreed to come in to see Wilburt Finlay PA-C tomorrow 8/10 at 2:30 pm at [facility], and will also have labs drawn while she is in the office.  Left message for Caitlyn to call back with any further questions.  Will route this message to Dr Excell Seltzer, who will resume as her Primary Cardiologist, and nurse as an fyi.

## 2015-06-03 NOTE — Telephone Encounter (Signed)
New message      On discharge paper work, pt is to have a repeat CBC and BNP.  Want order for home health nurse to draw labs on thurs the 11th and send results to [place]. Please call [facility] nurse and ok to leave verbal order on vm. Family want pt to see a doctor not a PA or NP.  Please call Gloria--(daughter) at [phone].  They are from [facility] and is not use to seeing NP or PA.  Home health nurse want to know if pt can see a doctor soon.  I told them our PA's and NP's see the hosp follow up appts but they want to talk to a nurse

## 2015-06-04 ENCOUNTER — Encounter: Payer: Self-pay | Admitting: Physician Assistant

## 2015-06-04 ENCOUNTER — Ambulatory Visit (INDEPENDENT_AMBULATORY_CARE_PROVIDER_SITE_OTHER): Payer: Medicare Other | Admitting: Physician Assistant

## 2015-06-04 ENCOUNTER — Other Ambulatory Visit (INDEPENDENT_AMBULATORY_CARE_PROVIDER_SITE_OTHER): Payer: Medicare Other | Admitting: *Deleted

## 2015-06-04 VITALS — BP 164/70 | HR 73 | Ht 64.0 in | Wt 175.0 lb

## 2015-06-04 DIAGNOSIS — N179 Acute kidney failure, unspecified: Secondary | ICD-10-CM

## 2015-06-04 DIAGNOSIS — J441 Chronic obstructive pulmonary disease with (acute) exacerbation: Secondary | ICD-10-CM

## 2015-06-04 DIAGNOSIS — I5023 Acute on chronic systolic (congestive) heart failure: Secondary | ICD-10-CM

## 2015-06-04 DIAGNOSIS — N189 Chronic kidney disease, unspecified: Secondary | ICD-10-CM

## 2015-06-04 MED ORDER — POTASSIUM CHLORIDE CRYS ER 20 MEQ PO TBCR: 20.0000 meq | EXTENDED_RELEASE_TABLET | Freq: Every day | ORAL | Status: AC

## 2015-06-04 MED ORDER — PANTOPRAZOLE SODIUM 40 MG PO TBEC: 40.0000 mg | DELAYED_RELEASE_TABLET | Freq: Every day | ORAL | Status: AC

## 2015-06-04 MED ORDER — TORSEMIDE 20 MG PO TABS: 20.0000 mg | ORAL_TABLET | Freq: Every day | ORAL | Status: AC

## 2015-06-04 MED ORDER — CLONIDINE HCL 0.2 MG PO TABS: 0.2000 mg | ORAL_TABLET | Freq: Two times a day (BID) | ORAL | Status: AC

## 2015-06-04 MED ORDER — AMLODIPINE BESYLATE 10 MG PO TABS: 10.0000 mg | ORAL_TABLET | Freq: Every day | ORAL | Status: AC

## 2015-06-04 MED ORDER — HYDRALAZINE HCL 100 MG PO TABS: 100.0000 mg | ORAL_TABLET | Freq: Three times a day (TID) | ORAL | Status: AC

## 2015-06-04 MED ORDER — LEVALBUTEROL HCL 1.25 MG/3ML IN NEBU: 1.0000 | INHALATION_SOLUTION | Freq: Four times a day (QID) | RESPIRATORY_TRACT | Status: DC

## 2015-06-04 NOTE — Progress Notes (Signed)
Patient ID: Leslie Parrish, female   DOB: 1928-09-15, 81 y.o.   MRN: [id number]    Date:  06/04/2015   ID:  Leslie Parrish, DOB [birth year], MRN [id number]  PCP:  No primary care provider on file.  Primary Cardiologist:  new Excell Seltzer  Chief complaint: Post hospital follow-up   History of Present Illness:  Leslie Parrish is a 81 y.o. female with chronic systolic heart failure, coronary artery disease status post CABG, chronic respiratory failure on home oxygen, was hospitalized with shortness of breath. She was felt to have COPD exacerbation as well as some evidence of congestive heart failure with elevated BNP and bilateral pleural effusions. Also with episodes of bradycardia on telemetry. Her heart rate has been as low as 30 bpm at times.   Avoid beta blockers. She has also been seen to have frequent PVCs, both isolated and consecutive. She has also had what appears to be atrial tachycardia with aberrancy.  She is high risk for kidney injury so ARB and ACE inhibitor's are being avoided.  An echocardiogram on 05/16/2015 which revealed an ejection fraction of 30-35%. Grade 2 diastolic dysfunction. Mild MR and mild stenosis. Left atrium moderately dilated right ventricular systolic function was moderately reduced. Mild TR. Pulmonary artery systolic pressure was in the normal range.  Patient presents for posthospital follow-up.  She is chronically ill and on oxygen therapy. She's not in any acute distress was is in a wheelchair.  Weight is down. She is chronically short of breath. She seems to wake up in the middle the night short of breath and has to be moved to a recliner however, she can move be moved back to bed and sleep for 4 hours flat without PND.  She gets absolutely no exercise.  The patient currently denies nausea, vomiting, fever, chest pain, dizziness, cough, congestion, abdominal pain, hematochezia, melena,.  Wt Readings from Last 3 Encounters:  06/04/15 175 lb (79.379 kg)  05/22/15 181 lb 3.5 oz  (82.2 kg)  04/22/15 175 lb (79.379 kg)     Past Medical History  Diagnosis Date  . Hypertension   . CHF (congestive heart failure)   . GERD (gastroesophageal reflux disease)   . COPD (chronic obstructive pulmonary disease)   . Asthma   . Diabetes mellitus without complication   . UTI (lower urinary tract infection)   . CAD (coronary artery disease)     Current Outpatient Prescriptions  Medication Sig Dispense Refill  . amLODipine (NORVASC) 10 MG tablet Take 10 mg by mouth daily.    Marland Kitchen anastrozole (ARIMIDEX) 1 MG tablet Take 1 mg by mouth daily.    Marland Kitchen aspirin EC 81 MG tablet Take 81 mg by mouth daily.    . Cholecalciferol (VITAMIN D-3 PO) Take 1 tablet by mouth daily.    . cloNIDine (CATAPRES) 0.2 MG tablet Take 0.2 mg by mouth 2 (two) times daily.    Marland Kitchen dicyclomine (BENTYL) 20 MG tablet Take 20 mg by mouth daily as needed for spasms.    . hydrALAZINE (APRESOLINE) 100 MG tablet Take 100 mg by mouth 3 (three) times daily.    Marland Kitchen ketoconazole (NIZORAL) 2 % cream Apply 1 application topically at bedtime.  3  . levalbuterol (XOPENEX) 1.25 MG/3ML nebulizer solution Inhale 1 ampule into the lungs 4 (four) times daily.  2  . ondansetron (ZOFRAN ODT) 4 MG disintegrating tablet Take 1 tablet (4 mg total) by mouth every 8 (eight) hours as needed for nausea or vomiting. 20 tablet 0  . oxyCODONE (  OXY IR/ROXICODONE) 5 MG immediate release tablet Take 0.5 tablets by mouth 2 (two) times daily as needed. pain  0  . pantoprazole (PROTONIX) 40 MG tablet Take 40 mg by mouth daily.    . polyethylene glycol (MIRALAX / GLYCOLAX) packet Take 17 g by mouth 2 (two) times daily. 30 each 0  . potassium chloride SA (K-DUR,KLOR-CON) 20 MEQ tablet Take 20 mEq by mouth daily.    Marland Kitchen torsemide (DEMADEX) 20 MG tablet Take 20 mg by mouth daily.     No current facility-administered medications for this visit.    Allergies:    Allergies  Allergen Reactions  . Albuterol Sulfate Other (See Comments)    Per Md- does not  want her taking because it raises her blood pressure.   Marland Kitchen Penicillins Swelling  . Sulfa Antibiotics Rash  . Compazine [Prochlorperazine Edisylate] Palpitations    Social History:  The patient  reports that she has never smoked. She has never used smokeless tobacco. She reports that she does not drink alcohol or use illicit drugs.   Family history:   Family History  Problem Relation Age of Onset  . Family history unknown: Yes    ROS:  Please see the history of present illness.  All other systems reviewed and negative.   PHYSICAL EXAM: VS:  BP 164/70 mmHg  Pulse 73  Ht 5\' 4"  (1.626 m)  Wt 175 lb (79.379 kg)  BMI 30.02 kg/m2  SpO2 93% obese, well developed, she is on chronic oxygen therapyin no acute distress HEENT: Pupils are equal round react to light accommodation extraocular movements are intact.  Neck: no JVDNo cervical lymphadenopathy. Cardiac: Regular rate and rhythm without murmurs rubs or gallops. Lungs:  Good inspirationpositive rhonchi Abd: soft, nontender, positive bowel sounds all quadrants, no hepatosplenomegaly Ext: 3+ right lower extremity edema from the shin down and 2+ pedal edema on the left.  2+ radial and dorsalis pedis pulses. Skin: warm and dry Neuro:  Grossly normal      ASSESSMENT AND PLAN:  1. Acute on chronic systolic heart failure with LVEF 30-35% Patient has considerable right lower extremity edema from mid shin down(mostly chronic after vein harvest) and in the left foot.  She appears to be better than she was previously.  Her weight is down 6 pounds from discharge. She is on torsemide 20 mg daily which we'll continue.  We'll also try to continue home health for CHF management. I emphasized the importance of daily weight monitoring and low-sodium diet both to monitor for volume overload or dehydration. Her daughter was present during the discussion. 2. CAD status post CABG,  no active symptoms of angina 3. Nonsustained VT/atrial tachycardia with  aberrancy/periods of marked sinus bradycardia  Rate well controlled on exam today. 4. Type 2 diabetes 5. Hypertension, uncontrolled 6. Acute on Chronic Kidney Injury  Check bmet today 7. Chronic anemia  Check CBC today 8.  Obesity We discussed trying to do some exercise even if it is just leg lifts in her chair or standing up and sitting down repeatedly. 9: COPD  I refilled her Xopenex.  Needs to find a primary care provider.

## 2015-06-04 NOTE — Patient Instructions (Signed)
Medication Instructions:  Your physician recommends that you continue on your current medications as directed. Please refer to the Current Medication list given to you today.   Labwork: Bmet, Cbc today  Testing/Procedures: None   Follow-Up: Your physician recommends that you schedule a follow-up appointment in:1-2 months with a PA/NP  Your physician recommends that you schedule a follow-up appointment in: 3 months with Dr.Cooper   Any Other Special Instructions Will Be Listed Below (If Applicable). Continue Homehealth for CHF Management

## 2015-06-09 ENCOUNTER — Other Ambulatory Visit: Payer: Self-pay | Admitting: Cardiovascular Disease

## 2015-06-09 MED ORDER — LEVALBUTEROL HCL 1.25 MG/3ML IN NEBU: 1.0000 | INHALATION_SOLUTION | Freq: Four times a day (QID) | RESPIRATORY_TRACT | Status: DC

## 2015-06-09 NOTE — Telephone Encounter (Signed)
Pt's daughter is calling in stating that Judie Grieve gave the pt a sample of Levalbuterol and the pt was needing more of this . The drug company is requesting a prior authorization for the generic equivalent to this medication and the pt's daughter is wanting a 30 day supply. She says that they were not able to get in with a pulmonary doctor until Sept 10th. Please call  The prior authorization can be called in at [phone]  Thanks

## 2015-06-09 NOTE — Telephone Encounter (Signed)
Fine to do Rx thx

## 2015-06-09 NOTE — Telephone Encounter (Signed)
Pt's daughter, Malachi Bonds requesting Dr Excell Seltzer prescribe Levalbuterol  (generic Xopenex) 1.25mg /66ml inhale in to lungs four times a day. Malachi Bonds requesting prescription  for 30 days until appt with pulmonologist, Dr Kriste Basque 07/05/15.  Malachi Bonds states Levalbuterol will need prior authorization-- [employer] Medicare preferred: Rx group number [id number] Rx PCN [id number] Rx Bin [id number] Rx ID [id number].  Pt's daughter advised I will forward to Dr Excell Seltzer for review to refill Levalbuterol.

## 2015-06-09 NOTE — Telephone Encounter (Signed)
I spoke with Leslie Parrish [phone] Part D Per [place]-coverage for Levalbuterol is denied, since coverage for Levalbuterol is denied by [facility] Part D the pharmacy should run prescription through on Medicare Part B, Levalbuterol should then be covered under Medicare Part B.

## 2015-06-09 NOTE — Telephone Encounter (Signed)
Pt's daughter,Gloria calling to advise pulmonologist appt is 06/23/15 with Dr Kendrick Fries, Malachi Bonds is still requesting 30 day prescription for Levalbuterol from Dr Excell Seltzer.

## 2015-06-09 NOTE — Telephone Encounter (Signed)
I received verbal permission to speak with pt's daughter, Malachi Bonds.

## 2015-06-09 NOTE — Telephone Encounter (Signed)
I spoke with Leslie Parrish at [facility] is aware that Medicare Part D denied coverage for Levalbuterol, Medicare Part B should cover since denied by Medicare Part D.   Leslie Parrish states [facility] does not have any Levalbuterol in stock and will have to order.  Per Leslie Parrish [facility], [phone], has 2 boxes (5 boxes 30 day supply) -pt can pick up 2 boxes now and they can order 3 boxes for a 30 day supply.  Leslie Parrish states he will call [facility] and give them a heads up. Pt's daughter, Leslie Parrish aware of above. Leslie Parrish is going to contact [facility] to get  Levalbuterol prescription.

## 2015-06-09 NOTE — Telephone Encounter (Signed)
F/u   Pt's daughter called back and stated her appt with Pulmonary is 8.29.16 @ 2:45. Please call pt's daughter back.

## 2015-06-10 ENCOUNTER — Telehealth: Payer: Self-pay

## 2015-06-10 NOTE — Telephone Encounter (Signed)
Phone call from daughter Malachi Bonds, inquiring about a certificate of medical necessity. This is for her Xopenex for nebulizer machine. Pharm tech from [employer] it was faxed this am. Have not seen it yet. Will check in Dr. Earmon Phoenix box.

## 2015-06-10 NOTE — Telephone Encounter (Signed)
Patient requests a ninety day refill. Please advise. Thanks, MI

## 2015-06-10 NOTE — Telephone Encounter (Signed)
Ok to refill 

## 2015-06-13 ENCOUNTER — Emergency Department (HOSPITAL_COMMUNITY)
Admission: EM | Admit: 2015-06-13 | Discharge: 2015-06-13 | Discharge status: Against Medical Advice | Payer: Medicare Other | Attending: Emergency Medicine | Admitting: Emergency Medicine

## 2015-06-13 ENCOUNTER — Encounter (HOSPITAL_COMMUNITY): Payer: Self-pay | Admitting: Emergency Medicine

## 2015-06-13 DIAGNOSIS — E119 Type 2 diabetes mellitus without complications: Secondary | ICD-10-CM | POA: Insufficient documentation

## 2015-06-13 DIAGNOSIS — R300 Dysuria: Secondary | ICD-10-CM | POA: Diagnosis not present

## 2015-06-13 DIAGNOSIS — Z1635 Resistance to multiple antimicrobial drugs: Secondary | ICD-10-CM

## 2015-06-13 DIAGNOSIS — I10 Essential (primary) hypertension: Secondary | ICD-10-CM | POA: Diagnosis not present

## 2015-06-13 DIAGNOSIS — K5900 Constipation, unspecified: Secondary | ICD-10-CM

## 2015-06-13 DIAGNOSIS — I509 Heart failure, unspecified: Secondary | ICD-10-CM | POA: Diagnosis not present

## 2015-06-13 DIAGNOSIS — N289 Disorder of kidney and ureter, unspecified: Secondary | ICD-10-CM | POA: Diagnosis not present

## 2015-06-13 DIAGNOSIS — I2510 Atherosclerotic heart disease of native coronary artery without angina pectoris: Secondary | ICD-10-CM | POA: Insufficient documentation

## 2015-06-13 DIAGNOSIS — R350 Frequency of micturition: Secondary | ICD-10-CM | POA: Diagnosis present

## 2015-06-13 DIAGNOSIS — E860 Dehydration: Secondary | ICD-10-CM

## 2015-06-13 DIAGNOSIS — J449 Chronic obstructive pulmonary disease, unspecified: Secondary | ICD-10-CM | POA: Diagnosis not present

## 2015-06-13 DIAGNOSIS — N390 Urinary tract infection, site not specified: Secondary | ICD-10-CM | POA: Insufficient documentation

## 2015-06-13 DIAGNOSIS — N189 Chronic kidney disease, unspecified: Secondary | ICD-10-CM

## 2015-06-13 DIAGNOSIS — N179 Acute kidney failure, unspecified: Secondary | ICD-10-CM

## 2015-06-13 LAB — URINALYSIS, ROUTINE W REFLEX MICROSCOPIC
Bilirubin Urine: NEGATIVE
GLUCOSE, UA: NEGATIVE mg/dL
Hgb urine dipstick: NEGATIVE
Ketones, ur: NEGATIVE mg/dL
Nitrite: POSITIVE — AB
Protein, ur: 30 mg/dL — AB
SPECIFIC GRAVITY, URINE: 1.011 (ref 1.005–1.030)
Urobilinogen, UA: 0.2 mg/dL (ref 0.0–1.0)
pH: 5.5 (ref 5.0–8.0)

## 2015-06-13 LAB — CBC WITH DIFFERENTIAL/PLATELET
Basophils Absolute: 0 10*3/uL (ref 0.0–0.1)
Basophils Relative: 1 % (ref 0–1)
Eosinophils Absolute: 0.2 10*3/uL (ref 0.0–0.7)
Eosinophils Relative: 4 % (ref 0–5)
HCT: 29.6 % — ABNORMAL LOW (ref 36.0–46.0)
Hemoglobin: 9 g/dL — ABNORMAL LOW (ref 12.0–15.0)
Lymphocytes Relative: 21 % (ref 12–46)
Lymphs Abs: 0.9 10*3/uL (ref 0.7–4.0)
MCH: 27 pg (ref 26.0–34.0)
MCHC: 30.4 g/dL (ref 30.0–36.0)
MCV: 88.9 fL (ref 78.0–100.0)
Monocytes Absolute: 0.4 10*3/uL (ref 0.1–1.0)
Monocytes Relative: 8 % (ref 3–12)
Neutro Abs: 2.8 10*3/uL (ref 1.7–7.7)
Neutrophils Relative %: 66 % (ref 43–77)
Platelets: 209 10*3/uL (ref 150–400)
RBC: 3.33 MIL/uL — ABNORMAL LOW (ref 3.87–5.11)
RDW: 16.6 % — ABNORMAL HIGH (ref 11.5–15.5)
WBC: 4.3 10*3/uL (ref 4.0–10.5)

## 2015-06-13 LAB — BASIC METABOLIC PANEL
Anion gap: 9 (ref 5–15)
BUN: 60 mg/dL — ABNORMAL HIGH (ref 6–20)
CO2: 27 mmol/L (ref 22–32)
Calcium: 8.9 mg/dL (ref 8.9–10.3)
Chloride: 104 mmol/L (ref 101–111)
Creatinine, Ser: 1.99 mg/dL — ABNORMAL HIGH (ref 0.44–1.00)
GFR calc Af Amer: 25 mL/min — ABNORMAL LOW (ref >60)
GFR calc non Af Amer: 22 mL/min — ABNORMAL LOW (ref >60)
Glucose, Bld: 143 mg/dL — ABNORMAL HIGH (ref 65–99)
Potassium: 3.9 mmol/L (ref 3.5–5.1)
Sodium: 140 mmol/L (ref 135–145)

## 2015-06-13 LAB — URINE MICROSCOPIC-ADD ON

## 2015-06-13 MED ORDER — SODIUM CHLORIDE 0.9 % IV SOLN
250.0000 mg | Freq: Once | INTRAVENOUS | Status: DC
  Filled 2015-06-13: qty 250

## 2015-06-13 NOTE — ED Notes (Signed)
Pt asked an MD who was passing by her room a question about her care. MD stated that he would try to find someone to help her and the pt stated "Get the fuck out of here, then!"

## 2015-06-13 NOTE — ED Notes (Signed)
Nurse getting labs 

## 2015-06-13 NOTE — ED Notes (Signed)
Pt states that she been having frequent urination, pain when urinating and foul odor over the past few days. Pt states that she has been taking Arzo every 6 hours over the past couple days.

## 2015-06-13 NOTE — Progress Notes (Signed)
Patient noted to have been seen in the ED 8 times within the last six months.  Patient with Medicare insurance lives with her daughter at home.  Patient noted to have been admitted and discharged from the hospital from 07/20 to 07/28 with CHF.  Patient was discharged with home health services for visiting RN, PT and aide with [facility].

## 2015-06-13 NOTE — ED Notes (Signed)
Escorted Dr. Freida Busman to bedside. Family insist pt be seen by MD instead of PA. Dr. Freida Busman agreeable to medical demands of pt and family. Pt and family given [place] sandwiches, several juices and cups with ice.

## 2015-06-13 NOTE — ED Notes (Signed)
MD at bedside. Hospitalist at bedside. Pt refuses to be admitted per recommendation of hospitalist.

## 2015-06-13 NOTE — Consult Note (Addendum)
[facility] Initial Consult Note   Leslie Parrish  ZOX:[id number]  DOB: 06-Oct-1928  DOA: 06/13/2015 DOS: the patient was seen and examined on 06/13/2015  PCP: No primary care provider on file.   Referring physician: Dr. Freida Busman Reason for consult: admission  HPI: Leslie Parrish is a 81 y.o. female with Past medical history of chronic diastolic CHF, GERD, COPD on chronic oxygen with chronic respiratory failure, hypertension, diabetes mellitus, chronic constipation, coronary artery disease. Patient is coming from home. The patient is presenting with complaints of burning urination. The patient appears to be a poor historian and many Place her history frequently. Initially the patient mentions to the ER physician that she presented to the ER with the complaints of burning urination but at the time of my evaluation after initially agreeing that she does have burning urination later on since he that she did not have any burning urination for last 2 days and she has 3 urination here in the ER and did not have any burning. She denied any fever or chills denies any chest pain or abdominal pain. She denied any nausea or vomiting at the time of my evaluation but did have some vomiting 4 days ago. She did have 4 days ago continuous pain in the lower abdominal area. After that she had a bowel movement her pain is resolved. She has chronic constipation and goes without bowel movement for more than 5 days. She does not take regular stool softener. She denies any abdominal pain or flank pain at the time of my evaluation.  Review of Systems: as mentioned in the history of present illness.  A Comprehensive review of the other systems is negative.  Past Medical History  Diagnosis Date  . Hypertension   . CHF (congestive heart failure)   . GERD (gastroesophageal reflux disease)   . COPD (chronic obstructive pulmonary disease)   . Asthma   . Diabetes mellitus without complication   . UTI (lower urinary  tract infection)   . CAD (coronary artery disease)    Past Surgical History  Procedure Laterality Date  . Abdominal hysterectomy    . Appendectomy    . Coronary artery bypass graft     Social History:  reports that she has never smoked. She has never used smokeless tobacco. She reports that she does not drink alcohol or use illicit drugs.  Allergies  Allergen Reactions  . Albuterol Sulfate Other (See Comments)    Per Md- does not want her taking because it raises her blood pressure.   Marland Kitchen Penicillins Swelling  . Sulfa Antibiotics Rash  . Compazine [Prochlorperazine Edisylate] Palpitations    Family History  Problem Relation Age of Onset  . Family history unknown: Yes    Prior to Admission medications   Medication Sig Start Date End Date Taking? Authorizing Provider  amLODipine (NORVASC) 10 MG tablet Take 1 tablet (10 mg total) by mouth daily. 06/04/15  Yes Dwana Melena, PA-C  anastrozole (ARIMIDEX) 1 MG tablet Take 1 mg by mouth daily.   Yes Historical Provider, MD  aspirin EC 81 MG tablet Take 81 mg by mouth daily.   Yes Historical Provider, MD  Cholecalciferol (VITAMIN D-3 PO) Take 1 tablet by mouth daily.   Yes Historical Provider, MD  cloNIDine (CATAPRES) 0.2 MG tablet Take 1 tablet (0.2 mg total) by mouth 2 (two) times daily. 06/04/15  Yes Dwana Melena, PA-C  dicyclomine (BENTYL) 20 MG tablet Take 20 mg by mouth daily as needed for spasms.  Yes Historical Provider, MD  hydrALAZINE (APRESOLINE) 100 MG tablet Take 1 tablet (100 mg total) by mouth 3 (three) times daily. 06/04/15  Yes Dwana Melena, PA-C  ketoconazole (NIZORAL) 2 % cream Apply 1 application topically at bedtime. 12/21/14  Yes Historical Provider, MD  levalbuterol Pauline Aus) 1.25 MG/3ML nebulizer solution INHALE CONTENTS OF 1 VIAL VIA NEBULIZER INTO THE LUNGS FOUR TIMES DAILY 06/10/15  Yes Tonny Bollman, MD  ondansetron (ZOFRAN ODT) 4 MG disintegrating tablet Take 1 tablet (4 mg total) by mouth every 8 (eight) hours  as needed for nausea or vomiting. 04/27/15  Yes Jerelyn Scott, MD  oxyCODONE (OXY IR/ROXICODONE) 5 MG immediate release tablet Take 0.5 tablets by mouth 2 (two) times daily as needed. pain 04/13/15  Yes Historical Provider, MD  pantoprazole (PROTONIX) 40 MG tablet Take 1 tablet (40 mg total) by mouth daily. 06/04/15  Yes Dwana Melena, PA-C  polyethylene glycol (MIRALAX / GLYCOLAX) packet Take 17 g by mouth 2 (two) times daily. Patient taking differently: Take 17 g by mouth daily as needed for moderate constipation.  05/22/15  Yes Elease Etienne, MD  potassium chloride SA (K-DUR,KLOR-CON) 20 MEQ tablet Take 1 tablet (20 mEq total) by mouth daily. 06/04/15  Yes Dwana Melena, PA-C  torsemide (DEMADEX) 20 MG tablet Take 1 tablet (20 mg total) by mouth daily. 06/04/15  Yes Dwana Melena, PA-C    Physical Exam: Filed Vitals:   06/13/15 1815  BP: 147/65  Pulse: 64  Temp: 98.2 F (36.8 C)  TempSrc: Oral  Resp: 19  SpO2: 89%    General: Alert, Awake and Oriented to Time, Place and Person. Appear in mild distress Eyes: PERRL ENT: Oral Mucosa clear moist. Neck: no JVD Cardiovascular: S1 and S2 Present, no Murmur, Peripheral Pulses Present Respiratory: Bilateral Air entry equal and Decreased,  Clear to Auscultation, no Crackles, no wheezes Abdomen: Bowel Sound present, Soft and non tenderness Skin: no Rash Extremities: Right more than left Pedal edema, no calf tenderness Neurologic: Grossly no focal neuro deficit.  Labs:  CBC:  Recent Labs Lab 06/13/15 1947  WBC 4.3  NEUTROABS 2.8  HGB 9.0*  HCT 29.6*  MCV 88.9  PLT 209   Basic Metabolic Panel:  Recent Labs Lab 06/13/15 1947  NA 140  K 3.9  CL 104  CO2 27  GLUCOSE 143*  BUN 60*  CREATININE 1.99*  CALCIUM 8.9   Assessment/Plan 1. Dysuria. The patient is presenting with multiple episodes of dysuria. Without any fever or leukocytosis I suspect that her Escherichia coli is more likely a colonizer. Although I also  discussed with Dr. Orvan Falconer on the phone for infectious disease recommends that the patient should be treated in the hospital since her symptoms resolved with antibiotics treatment last admission. With this the patient was recommended to be a no hospital for empiric IV antibiotics treatment.  2. Acute on chronic kidney injury. The patient has elevation of BUN as well as mild elevation of serum creatinine suggesting she dehydration. Recommend patient to be admitted in the hospital for close observation. Recommended patient to hold diuretic for one day.  3. Chronic constipation. Next and recommended patient to use stool softener for regular daily regimen as well as one stool softener for no bowel movement more more than 3 days.  Family Communication: Family member was present at the time of my evaluation.   Thank you very much for involving [place] in care of your patient.  After a prolonged discussion and extensive  explanation as well as risk of death, to the family as well as the patient, the patient has decided to leave AGAINST MEDICAL ADVICE. We will sign off at present, please call us again as needed.   Author: Lynden Oxford, MD [facility] Pager: [phone] 06/13/2015   09:30 PM  If 7PM-7AM, please contact night-coverage [email or web address] Password [email or web address]

## 2015-06-13 NOTE — ED Notes (Signed)
Leslie Parrish, [facility] at bedside speaking with pt and pt's daughter.

## 2015-06-13 NOTE — ED Provider Notes (Addendum)
CSN: [id number]     Arrival date & time 06/13/15  1754 History   First MD Initiated Contact with Patient 06/13/15 1853     Chief Complaint  Patient presents with  . Urinary Frequency  . Dysuria     (Consider location/radiation/quality/duration/timing/severity/associated sxs/prior Treatment) HPI Comments: Patient here complaining of dysuria times several days. Denies any abdominal pain. No flank discomfort. No vomiting or diarrhea. History of UTI in the past and this is similar. Has been using over-the-counter medications without relief. Nothing makes her symptoms better. Patient is chronically short of breath and is on home oxygen.  Patient is a 81 y.o. female presenting with frequency and dysuria. The history is provided by the patient and a relative.  Urinary Frequency  Dysuria   Past Medical History  Diagnosis Date  . Hypertension   . CHF (congestive heart failure)   . GERD (gastroesophageal reflux disease)   . COPD (chronic obstructive pulmonary disease)   . Asthma   . Diabetes mellitus without complication   . UTI (lower urinary tract infection)   . CAD (coronary artery disease)    Past Surgical History  Procedure Laterality Date  . Abdominal hysterectomy    . Appendectomy    . Coronary artery bypass graft     Family History  Problem Relation Age of Onset  . Family history unknown: Yes   Social History  Substance Use Topics  . Smoking status: Never Smoker   . Smokeless tobacco: Never Used  . Alcohol Use: No   OB History    Gravida Para Term Preterm AB TAB SAB Ectopic Multiple Living   6 6             Review of Systems  Genitourinary: Positive for dysuria and frequency.  All other systems reviewed and are negative.     Allergies  Albuterol sulfate; Penicillins; Sulfa antibiotics; and Compazine  Home Medications   Prior to Admission medications   Medication Sig Start Date End Date Taking? Authorizing Provider  amLODipine (NORVASC) 10 MG tablet Take  1 tablet (10 mg total) by mouth daily. 06/04/15  Yes Dwana Melena, PA-C  anastrozole (ARIMIDEX) 1 MG tablet Take 1 mg by mouth daily.   Yes Historical Provider, MD  aspirin EC 81 MG tablet Take 81 mg by mouth daily.   Yes Historical Provider, MD  Cholecalciferol (VITAMIN D-3 PO) Take 1 tablet by mouth daily.   Yes Historical Provider, MD  cloNIDine (CATAPRES) 0.2 MG tablet Take 1 tablet (0.2 mg total) by mouth 2 (two) times daily. 06/04/15  Yes Dwana Melena, PA-C  dicyclomine (BENTYL) 20 MG tablet Take 20 mg by mouth daily as needed for spasms.   Yes Historical Provider, MD  hydrALAZINE (APRESOLINE) 100 MG tablet Take 1 tablet (100 mg total) by mouth 3 (three) times daily. 06/04/15  Yes Dwana Melena, PA-C  ketoconazole (NIZORAL) 2 % cream Apply 1 application topically at bedtime. 12/21/14  Yes Historical Provider, MD  levalbuterol Pauline Aus) 1.25 MG/3ML nebulizer solution INHALE CONTENTS OF 1 VIAL VIA NEBULIZER INTO THE LUNGS FOUR TIMES DAILY 06/10/15  Yes Tonny Bollman, MD  ondansetron (ZOFRAN ODT) 4 MG disintegrating tablet Take 1 tablet (4 mg total) by mouth every 8 (eight) hours as needed for nausea or vomiting. 04/27/15  Yes Jerelyn Scott, MD  oxyCODONE (OXY IR/ROXICODONE) 5 MG immediate release tablet Take 0.5 tablets by mouth 2 (two) times daily as needed. pain 04/13/15  Yes Historical Provider, MD  pantoprazole (PROTONIX) 40 MG  tablet Take 1 tablet (40 mg total) by mouth daily. 06/04/15  Yes Dwana Melena, PA-C  polyethylene glycol (MIRALAX / GLYCOLAX) packet Take 17 g by mouth 2 (two) times daily. Patient taking differently: Take 17 g by mouth daily as needed for moderate constipation.  05/22/15  Yes Elease Etienne, MD  potassium chloride SA (K-DUR,KLOR-CON) 20 MEQ tablet Take 1 tablet (20 mEq total) by mouth daily. 06/04/15  Yes Dwana Melena, PA-C  torsemide (DEMADEX) 20 MG tablet Take 1 tablet (20 mg total) by mouth daily. 06/04/15  Yes Kelle Darting Hager, PA-C   BP 147/65 mmHg  Pulse 64   Temp(Src) 98.2 F (36.8 C) (Oral)  Resp 19  SpO2 89% Physical Exam  Constitutional: She is oriented to person, place, and time. She appears well-developed and well-nourished.  Non-toxic appearance. No distress.  HENT:  Head: Normocephalic and atraumatic.  Eyes: Conjunctivae, EOM and lids are normal. Pupils are equal, round, and reactive to light.  Neck: Normal range of motion. Neck supple. No tracheal deviation present. No thyroid mass present.  Cardiovascular: Normal rate, regular rhythm and normal heart sounds.  Exam reveals no gallop.   No murmur heard. Pulmonary/Chest: Effort normal and breath sounds normal. No stridor. No respiratory distress. She has no decreased breath sounds. She has no wheezes. She has no rhonchi. She has no rales.  Abdominal: Soft. Normal appearance and bowel sounds are normal. She exhibits no distension. There is no tenderness. There is no rebound and no CVA tenderness.  Musculoskeletal: Normal range of motion. She exhibits no edema or tenderness.  Neurological: She is alert and oriented to person, place, and time. She has normal strength. No cranial nerve deficit or sensory deficit. GCS eye subscore is 4. GCS verbal subscore is 5. GCS motor subscore is 6.  Skin: Skin is warm and dry. No abrasion and no rash noted.  Psychiatric: She has a normal mood and affect. Her speech is normal and behavior is normal.  Nursing note and vitals reviewed.   ED Course  Procedures (including critical care time) Labs Review Labs Reviewed  CBC WITH DIFFERENTIAL/PLATELET - Abnormal; Notable for the following:    RBC 3.33 (*)    Hemoglobin 9.0 (*)    HCT 29.6 (*)    RDW 16.6 (*)    All other components within normal limits  URINE CULTURE  URINALYSIS, ROUTINE W REFLEX MICROSCOPIC (NOT AT [facility])  BASIC METABOLIC PANEL    Imaging Review No results found. I have personally reviewed and evaluated these images and lab results as part of my medical decision-making.   EKG  Interpretation None      MDM   Final diagnoses:  None   patient with worsening renal insufficiency. UTI noted on patient's urinalysis. Prior microbiology results reviewed and patient will require admission for IV antibiotics    Lorre Nick, MD 06/13/15 2128  Lorre Nick, MD 06/13/15 2146

## 2015-06-13 NOTE — ED Notes (Signed)
MD at bedside. Hospitalist at bedside. 

## 2015-06-13 NOTE — ED Notes (Signed)
MD at bedside. Dr. Allen at bedside.  

## 2015-06-15 ENCOUNTER — Emergency Department (HOSPITAL_COMMUNITY)
Admission: EM | Admit: 2015-06-15 | Discharge: 2015-06-15 | Disposition: A | Discharge status: Home / Self Care | Payer: Medicare Other | Attending: Emergency Medicine | Admitting: Emergency Medicine

## 2015-06-15 ENCOUNTER — Encounter (HOSPITAL_COMMUNITY): Payer: Self-pay | Admitting: *Deleted

## 2015-06-15 DIAGNOSIS — I509 Heart failure, unspecified: Secondary | ICD-10-CM | POA: Diagnosis not present

## 2015-06-15 DIAGNOSIS — I129 Hypertensive chronic kidney disease with stage 1 through stage 4 chronic kidney disease, or unspecified chronic kidney disease: Secondary | ICD-10-CM | POA: Insufficient documentation

## 2015-06-15 DIAGNOSIS — N179 Acute kidney failure, unspecified: Secondary | ICD-10-CM | POA: Diagnosis not present

## 2015-06-15 DIAGNOSIS — N189 Chronic kidney disease, unspecified: Secondary | ICD-10-CM | POA: Insufficient documentation

## 2015-06-15 DIAGNOSIS — Z880 Allergy status to penicillin: Secondary | ICD-10-CM | POA: Diagnosis not present

## 2015-06-15 DIAGNOSIS — Z79899 Other long term (current) drug therapy: Secondary | ICD-10-CM | POA: Insufficient documentation

## 2015-06-15 DIAGNOSIS — Z90710 Acquired absence of both cervix and uterus: Secondary | ICD-10-CM | POA: Insufficient documentation

## 2015-06-15 DIAGNOSIS — E119 Type 2 diabetes mellitus without complications: Secondary | ICD-10-CM | POA: Insufficient documentation

## 2015-06-15 DIAGNOSIS — Z87440 Personal history of urinary (tract) infections: Secondary | ICD-10-CM | POA: Insufficient documentation

## 2015-06-15 DIAGNOSIS — I2510 Atherosclerotic heart disease of native coronary artery without angina pectoris: Secondary | ICD-10-CM | POA: Insufficient documentation

## 2015-06-15 DIAGNOSIS — Z951 Presence of aortocoronary bypass graft: Secondary | ICD-10-CM | POA: Insufficient documentation

## 2015-06-15 DIAGNOSIS — Z7982 Long term (current) use of aspirin: Secondary | ICD-10-CM | POA: Diagnosis not present

## 2015-06-15 DIAGNOSIS — J449 Chronic obstructive pulmonary disease, unspecified: Secondary | ICD-10-CM | POA: Diagnosis not present

## 2015-06-15 DIAGNOSIS — H109 Unspecified conjunctivitis: Secondary | ICD-10-CM

## 2015-06-15 DIAGNOSIS — K219 Gastro-esophageal reflux disease without esophagitis: Secondary | ICD-10-CM | POA: Diagnosis not present

## 2015-06-15 DIAGNOSIS — R300 Dysuria: Secondary | ICD-10-CM | POA: Diagnosis present

## 2015-06-15 LAB — CBC WITH DIFFERENTIAL/PLATELET
BASOS PCT: 1 % (ref 0–1)
Basophils Absolute: 0 10*3/uL (ref 0.0–0.1)
EOS PCT: 6 % — AB (ref 0–5)
Eosinophils Absolute: 0.2 10*3/uL (ref 0.0–0.7)
HEMATOCRIT: 32.4 % — AB (ref 36.0–46.0)
Hemoglobin: 10 g/dL — ABNORMAL LOW (ref 12.0–15.0)
LYMPHS PCT: 28 % (ref 12–46)
Lymphs Abs: 1.2 10*3/uL (ref 0.7–4.0)
MCH: 27.7 pg (ref 26.0–34.0)
MCHC: 30.9 g/dL (ref 30.0–36.0)
MCV: 89.8 fL (ref 78.0–100.0)
MONO ABS: 0.3 10*3/uL (ref 0.1–1.0)
MONOS PCT: 8 % (ref 3–12)
NEUTROS ABS: 2.4 10*3/uL (ref 1.7–7.7)
Neutrophils Relative %: 57 % (ref 43–77)
PLATELETS: 225 10*3/uL (ref 150–400)
RBC: 3.61 MIL/uL — ABNORMAL LOW (ref 3.87–5.11)
RDW: 16.2 % — AB (ref 11.5–15.5)
WBC: 4.2 10*3/uL (ref 4.0–10.5)

## 2015-06-15 LAB — BASIC METABOLIC PANEL
Anion gap: 9 (ref 5–15)
BUN: 58 mg/dL — ABNORMAL HIGH (ref 6–20)
CALCIUM: 9.3 mg/dL (ref 8.9–10.3)
CO2: 30 mmol/L (ref 22–32)
CREATININE: 1.94 mg/dL — AB (ref 0.44–1.00)
Chloride: 103 mmol/L (ref 101–111)
GFR calc Af Amer: 26 mL/min — ABNORMAL LOW (ref >60)
GFR, EST NON AFRICAN AMERICAN: 23 mL/min — AB (ref >60)
GLUCOSE: 224 mg/dL — AB (ref 65–99)
Potassium: 4.1 mmol/L (ref 3.5–5.1)
Sodium: 142 mmol/L (ref 135–145)

## 2015-06-15 MED ORDER — ERYTHROMYCIN 5 MG/GM OP OINT: TOPICAL_OINTMENT | OPHTHALMIC | Status: AC

## 2015-06-15 NOTE — ED Notes (Signed)
Pt reports being seen here for same yesterday.  Was dx with UTI but was not given abx, awaiting urine culture results.  Pt reports she does not feel any better.

## 2015-06-15 NOTE — Discharge Instructions (Signed)
Chronic Kidney Disease Chronic kidney disease occurs when the kidneys are damaged over a long period. The kidneys are two organs that lie on either side of the spine between the middle of the back and the front of the abdomen. The kidneys:   Remove wastes and extra water from the blood.   Produce important hormones. These help keep bones strong, regulate blood pressure, and help create red blood cells.   Balance the fluids and chemicals in the blood and tissues. A small amount of kidney damage may not cause problems, but a large amount of damage may make it difficult or impossible for the kidneys to work the way they should. If steps are not taken to slow down the kidney damage or stop it from getting worse, the kidneys may stop working permanently. Most of the time, chronic kidney disease does not go away. However, it can often be controlled, and those with the disease can usually live normal lives. CAUSES  The most common causes of chronic kidney disease are diabetes and high blood pressure (hypertension). Chronic kidney disease may also be caused by:   Diseases that cause the kidneys' filters to become inflamed.   Diseases that affect the immune system.   Genetic diseases.   Medicines that damage the kidneys, such as anti-inflammatory medicines.  Poisoning or exposure to toxic substances.   A reoccurring kidney or urinary infection.   A problem with urine flow. This may be caused by:   Cancer.   Kidney stones.   An enlarged prostate in males. SIGNS AND SYMPTOMS  Because the kidney damage in chronic kidney disease occurs slowly, symptoms develop slowly and may not be obvious until the kidney damage becomes severe. A person may have a kidney disease for years without showing any symptoms. Symptoms can include:   Swelling (edema) of the legs, ankles, or feet.   Tiredness (lethargy).   Nausea or vomiting.   Confusion.   Problems with urination, such as:    Decreased urine production.   Frequent urination, especially at night.   Frequent accidents in children who are potty trained.   Muscle twitches and cramps.   Shortness of breath.  Weakness.   Persistent itchiness.   Loss of appetite.  Metallic taste in the mouth.  Trouble sleeping.  Slowed development in children.  Short stature in children. DIAGNOSIS  Chronic kidney disease may be detected and diagnosed by tests, including blood, urine, imaging, or kidney biopsy tests.  TREATMENT  Most chronic kidney diseases cannot be cured. Treatment usually involves relieving symptoms and preventing or slowing the progression of the disease. Treatment may include:   A special diet. You may need to avoid alcohol and foods thatare salty and high in potassium.   Medicines. These may:   Lower blood pressure.   Relieve anemia.   Relieve swelling.   Protect the bones. HOME CARE INSTRUCTIONS   Follow your prescribed diet.   Take medicines only as directed by your health care provider. Do not take any new medicines (prescription, over-the-counter, or nutritional supplements) unless approved by your health care provider. Many medicines can worsen your kidney damage or need to have the dose adjusted.   Quit smoking if you smoke. Talk to your health care provider about a smoking cessation program.   Keep all follow-up visits as directed by your health care provider. SEEK IMMEDIATE MEDICAL CARE IF:  Your symptoms get worse or you develop new symptoms.   You develop symptoms of end-stage kidney disease. These  include:   Headaches.   Abnormally dark or light skin.   Numbness in the hands or feet.   Easy bruising.   Frequent hiccups.   Menstruation stops.   You have a fever.   You have decreased urine production.   You havepain or bleeding when urinating. MAKE SURE YOU:  Understand these instructions.  Will watch your condition.  Will  get help right away if you are not doing well or get worse. FOR MORE INFORMATION   American Association of Kidney Patients: ResidentialShow.is  National Kidney Foundation: www.kidney.org  American Kidney Fund: FightingMatch.com.ee  [employer] [facility]: www.lifeoptions.org and www.kidneyschool.org Document Released: 07/20/2008 Document Revised: 02/25/2014 Document Reviewed: 06/09/2012 [facility] Patient Information 2015 [place], [place]. This information is not intended to replace advice given to you by your health care provider. Make sure you discuss any questions you have with your health care provider.  Acute Kidney Injury Acute kidney injury is a disease in which there is sudden (acute) damage to the kidneys. The kidneys are 2 organs that lie on either side of the spine between the middle of the back and the front of the abdomen. The kidneys:  Remove wastes and extra water from the blood.   Produce important hormones. These help keep bones strong, regulate blood pressure, and help create red blood cells.   Balance the fluids and chemicals in the blood and tissues. A small amount of kidney damage may not cause problems, but a large amount of damage may make it difficult or impossible for the kidneys to work the way they should. Acute kidney injury may develop into long-lasting (chronic) kidney disease. It may also develop into a life-threatening disease called end-stage kidney disease. Acute kidney injury can get worse very quickly, so it should be treated right away. Early treatment may prevent other kidney diseases from developing.  CAUSES   A problem with blood flow to the kidneys. This may be caused by:   Blood loss.   Heart disease.   Severe burns.   Liver disease.  Direct damage to the kidneys. This may be caused by:  Some medicines.   A kidney infection.   Poisoning or consuming toxic substances.   A surgical wound.   A blow to the kidney area.   A  problem with urine flow. This may be caused by:   Cancer.   Kidney stones.   An enlarged prostate. SYMPTOMS   Swelling (edema) of the legs, ankles, or feet.   Tiredness (lethargy).   Nausea or vomiting.   Confusion.   Problems with urination, such as:   Painful or burning feeling during urination.   Decreased urine production.   Frequent accidents in children who are potty trained.   Bloody urine.   Muscle twitches and cramps.   Shortness of breath.   Seizures.   Chest pain or pressure. Sometimes, no symptoms are present. DIAGNOSIS Acute kidney injury may be detected and diagnosed by tests, including blood, urine, imaging, or kidney biopsy tests.  TREATMENT Treatment of acute kidney injury varies depending on the cause and severity of the kidney damage. In mild cases, no treatment may be needed. The kidneys may heal on their own. If acute kidney injury is more severe, your caregiver will treat the cause of the kidney damage, help the kidneys heal, and prevent complications from occurring. Severe cases may require a procedure to remove toxic wastes from the body (dialysis) or surgery to repair kidney damage. Surgery may involve:   Repair  of a torn kidney.   Removal of an obstruction. Most of the time, you will need to stay overnight at the hospital.  HOME CARE INSTRUCTIONS:  Follow your prescribed diet.  Only take over-the-counter or prescription medicines as directed by your caregiver.  Do not take any new medicines (prescription, over-the-counter, or nutritional supplements) unless approved by your caregiver. Many medicines can worsen your kidney damage or need to have the dose adjusted.   Keep all follow-up appointments as directed by your caregiver.  Observe your condition to make sure you are healing as expected. SEEK IMMEDIATE MEDICAL CARE IF:  You are feeling ill or have severe pain in the back or side.   Your symptoms return or you  have new symptoms.  You have any symptoms of end-stage kidney disease. These include:   Persistent itchiness.   Loss of appetite.   Headaches.   Abnormally dark or light skin.  Numbness in the hands or feet.   Easy bruising.   Frequent hiccups.   Menstruation stops.   You have a fever.  You have increased urine production.  You have pain or bleeding when urinating. MAKE SURE YOU:   Understand these instructions.  Will watch your condition.  Will get help right away if you are not doing well or get worse Document Released: 04/26/2011 Document Revised: 02/05/2013 Document Reviewed: 06/09/2012 [facility] Patient Information 2015 [place], [place]. This information is not intended to replace advice given to you by your health care provider. Make sure you discuss any questions you have with your health care provider.

## 2015-06-15 NOTE — ED Provider Notes (Signed)
CSN: [id number]     Arrival date & time 06/15/15  1917 History   First MD Initiated Contact with Patient 06/15/15 2038     Chief Complaint  Patient presents with  . Urinary Tract Infection     (Consider location/radiation/quality/duration/timing/severity/associated sxs/prior Treatment) Patient is a 81 y.o. female presenting with urinary tract infection. The history is provided by the patient and a relative.  Urinary Tract Infection Associated symptoms: no abdominal pain    patient returns for recheck. She was seen in the ER 2 days ago and admission was requested for UTI and worsening renal function. Patient has history of CHF and chronic renal insufficiency. She has had chronic lower urinary tract infections. She has a rather resistant organism. She was seen by Dr. Allena Katz for admission and he thought the urine was likely colonized and not necessarily infected. Has had some burning less burning with urination than normal. Patient had a creatinine that was about 2 with a baseline of around 1.8. No chest pain or shortness of breath. She was not willing to be admitted returns today to be checked out again. Daughter also states her right eye is not red and crusty. Patient is a somewhat difficult historian. They state that she does not have primary care doctor and they will be moving away in 15 days. Past Medical History  Diagnosis Date  . Hypertension   . CHF (congestive heart failure)   . GERD (gastroesophageal reflux disease)   . COPD (chronic obstructive pulmonary disease)   . Asthma   . Diabetes mellitus without complication   . UTI (lower urinary tract infection)   . CAD (coronary artery disease)    Past Surgical History  Procedure Laterality Date  . Abdominal hysterectomy    . Appendectomy    . Coronary artery bypass graft     Family History  Problem Relation Age of Onset  . Family history unknown: Yes   Social History  Substance Use Topics  . Smoking status: Never Smoker   .  Smokeless tobacco: Never Used  . Alcohol Use: No   OB History    Gravida Para Term Preterm AB TAB SAB Ectopic Multiple Living   6 6             Review of Systems  Constitutional: Negative for chills and diaphoresis.  Respiratory: Negative for shortness of breath (patient is on chronic oxygen).   Cardiovascular: Positive for leg swelling. Negative for chest pain.  Gastrointestinal: Negative for abdominal pain.  Genitourinary: Positive for dysuria.  Musculoskeletal: Negative for back pain.  Skin: Negative for color change and wound.  Neurological: Negative for tremors.      Allergies  Albuterol sulfate; Penicillins; Sulfa antibiotics; and Compazine  Home Medications   Prior to Admission medications   Medication Sig Start Date End Date Taking? Authorizing Provider  amLODipine (NORVASC) 10 MG tablet Take 1 tablet (10 mg total) by mouth daily. 06/04/15  Yes Dwana Melena, PA-C  anastrozole (ARIMIDEX) 1 MG tablet Take 1 mg by mouth daily.   Yes Historical Provider, MD  aspirin EC 81 MG tablet Take 81 mg by mouth daily.   Yes Historical Provider, MD  Cholecalciferol (VITAMIN D-3 PO) Take 1 tablet by mouth daily.   Yes Historical Provider, MD  cloNIDine (CATAPRES) 0.2 MG tablet Take 1 tablet (0.2 mg total) by mouth 2 (two) times daily. 06/04/15  Yes Dwana Melena, PA-C  dicyclomine (BENTYL) 20 MG tablet Take 20 mg by mouth daily as needed  for spasms.   Yes Historical Provider, MD  hydrALAZINE (APRESOLINE) 100 MG tablet Take 1 tablet (100 mg total) by mouth 3 (three) times daily. 06/04/15  Yes Dwana Melena, PA-C  ketoconazole (NIZORAL) 2 % cream Apply 1 application topically at bedtime. 12/21/14  Yes Historical Provider, MD  levalbuterol Pauline Aus) 1.25 MG/3ML nebulizer solution INHALE CONTENTS OF 1 VIAL VIA NEBULIZER INTO THE LUNGS FOUR TIMES DAILY 06/10/15  Yes Tonny Bollman, MD  ondansetron (ZOFRAN ODT) 4 MG disintegrating tablet Take 1 tablet (4 mg total) by mouth every 8 (eight) hours as  needed for nausea or vomiting. 04/27/15  Yes Jerelyn Scott, MD  oxyCODONE (OXY IR/ROXICODONE) 5 MG immediate release tablet Take 2.5 mg by mouth 2 (two) times daily as needed for moderate pain or severe pain.  04/13/15  Yes Historical Provider, MD  pantoprazole (PROTONIX) 40 MG tablet Take 1 tablet (40 mg total) by mouth daily. 06/04/15  Yes Dwana Melena, PA-C  polyethylene glycol (MIRALAX / GLYCOLAX) packet Take 17 g by mouth 2 (two) times daily. Patient taking differently: Take 17 g by mouth daily as needed for moderate constipation.  05/22/15  Yes Elease Etienne, MD  potassium chloride SA (K-DUR,KLOR-CON) 20 MEQ tablet Take 1 tablet (20 mEq total) by mouth daily. 06/04/15  Yes Dwana Melena, PA-C  torsemide (DEMADEX) 20 MG tablet Take 1 tablet (20 mg total) by mouth daily. 06/04/15  Yes Dwana Melena, PA-C  erythromycin ophthalmic ointment Place a 1/2 inch ribbon of ointment into the lower eyelid. 06/15/15   Benjiman Core, MD   BP 159/71 mmHg  Pulse 75  Temp(Src) 97.7 F (36.5 C) (Oral)  Resp 20  SpO2 96% Physical Exam  Constitutional: She appears well-developed and well-nourished.  HENT:  Head: Atraumatic.  Eyes: Pupils are equal, round, and reactive to light.  Crusting and some erythema right eyelid.  Cardiovascular: Normal rate.   Pulmonary/Chest: Effort normal. No respiratory distress.  Abdominal: Soft. There is no tenderness.  Musculoskeletal:  Chronic lymphedema right lower leg.  Skin: Skin is warm.    ED Course  Procedures (including critical care time) Labs Review Labs Reviewed  CBC WITH DIFFERENTIAL/PLATELET - Abnormal; Notable for the following:    RBC 3.61 (*)    Hemoglobin 10.0 (*)    HCT 32.4 (*)    RDW 16.2 (*)    Eosinophils Relative 6 (*)    All other components within normal limits  BASIC METABOLIC PANEL - Abnormal; Notable for the following:    Glucose, Bld 224 (*)    BUN 58 (*)    Creatinine, Ser 1.94 (*)    GFR calc non Af Amer 23 (*)    GFR calc Af  Amer 26 (*)    All other components within normal limits    Imaging Review No results found. I have personally reviewed and evaluated these images and lab results as part of my medical decision-making.   EKG Interpretation None      MDM   Final diagnoses:  Renal failure, acute on chronic  Conjunctivitis of right eye    Patient presents for recheck. Lab is similar to what it was 2 days ago. Will not recheck urine. At this time where acting as if it is a colonization. Has follow-up in 2 days with PCP that family and patient did not seem to be aware of. May need adjustment of her medications at that time. Discussed with hospitalist that was initially going to admit her, Dr. Allena Katz.  Benjiman Core, MD 06/15/15 [phone]

## 2015-06-16 LAB — URINE CULTURE: Culture: >=100000

## 2015-06-17 ENCOUNTER — Encounter: Payer: Self-pay | Admitting: Internal Medicine

## 2015-06-17 ENCOUNTER — Other Ambulatory Visit (INDEPENDENT_AMBULATORY_CARE_PROVIDER_SITE_OTHER): Payer: Medicare Other

## 2015-06-17 ENCOUNTER — Ambulatory Visit (INDEPENDENT_AMBULATORY_CARE_PROVIDER_SITE_OTHER): Payer: Medicare Other | Admitting: Internal Medicine

## 2015-06-17 ENCOUNTER — Telehealth (HOSPITAL_COMMUNITY): Payer: Self-pay | Admitting: Emergency Medicine

## 2015-06-17 VITALS — BP 128/60 | HR 70 | Temp 98.2°F | Resp 12 | Ht 64.5 in

## 2015-06-17 DIAGNOSIS — N179 Acute kidney failure, unspecified: Secondary | ICD-10-CM

## 2015-06-17 DIAGNOSIS — I5023 Acute on chronic systolic (congestive) heart failure: Secondary | ICD-10-CM

## 2015-06-17 DIAGNOSIS — I10 Essential (primary) hypertension: Secondary | ICD-10-CM

## 2015-06-17 DIAGNOSIS — J9611 Chronic respiratory failure with hypoxia: Secondary | ICD-10-CM | POA: Diagnosis not present

## 2015-06-17 DIAGNOSIS — K5900 Constipation, unspecified: Secondary | ICD-10-CM

## 2015-06-17 DIAGNOSIS — J449 Chronic obstructive pulmonary disease, unspecified: Secondary | ICD-10-CM

## 2015-06-17 DIAGNOSIS — I5022 Chronic systolic (congestive) heart failure: Secondary | ICD-10-CM

## 2015-06-17 DIAGNOSIS — N183 Chronic kidney disease, stage 3 unspecified: Secondary | ICD-10-CM

## 2015-06-17 DIAGNOSIS — N390 Urinary tract infection, site not specified: Secondary | ICD-10-CM

## 2015-06-17 DIAGNOSIS — E119 Type 2 diabetes mellitus without complications: Secondary | ICD-10-CM

## 2015-06-17 DIAGNOSIS — N189 Chronic kidney disease, unspecified: Secondary | ICD-10-CM

## 2015-06-17 LAB — CBC
HCT: 30.1 % — ABNORMAL LOW (ref 36.0–46.0)
Hemoglobin: 9.7 g/dL — ABNORMAL LOW (ref 12.0–15.0)
MCHC: 32.4 g/dL (ref 30.0–36.0)
MCV: 84.6 fl (ref 78.0–100.0)
Platelets: 227 10*3/uL (ref 150.0–400.0)
RBC: 3.56 Mil/uL — ABNORMAL LOW (ref 3.87–5.11)
RDW: 16.8 % — AB (ref 11.5–15.5)
WBC: 5.7 10*3/uL (ref 4.0–10.5)

## 2015-06-17 LAB — BASIC METABOLIC PANEL
BUN: 54 mg/dL — ABNORMAL HIGH (ref 6–23)
CO2: 28 mEq/L (ref 19–32)
Calcium: 9.3 mg/dL (ref 8.4–10.5)
Chloride: 102 mEq/L (ref 96–112)
Creatinine, Ser: 1.73 mg/dL — ABNORMAL HIGH (ref 0.40–1.20)
GFR: 29.76 mL/min — AB (ref >60.00)
Glucose, Bld: 189 mg/dL — ABNORMAL HIGH (ref 70–99)
POTASSIUM: 3.9 meq/L (ref 3.5–5.1)
SODIUM: 140 meq/L (ref 135–145)

## 2015-06-17 LAB — MAGNESIUM: MAGNESIUM: 2.1 mg/dL (ref 1.5–2.5)

## 2015-06-17 MED ORDER — OXYCODONE HCL 5 MG PO TABS: 2.5000 mg | ORAL_TABLET | Freq: Every day | ORAL | Status: AC | PRN

## 2015-06-17 MED ORDER — SENNA-DOCUSATE SODIUM 8.6-50 MG PO TABS: 1.0000 | ORAL_TABLET | Freq: Two times a day (BID) | ORAL | Status: AC

## 2015-06-17 NOTE — Progress Notes (Signed)
ED Antimicrobial Stewardship Positive Culture Follow Up   Leslie Parrish is an 81 y.o. female who presented to [facility] on 06/15/2015 with a chief complaint of  Chief Complaint  Patient presents with  . Urinary Tract Infection    Recent Results (from the past 720 hour(s))  Urine culture     Status: None   Collection Time: 06/13/15  7:56 PM  Result Value Ref Range Status   Specimen Description URINE, CATHETERIZED  Final   Special Requests NONE  Final   Culture   Final    >=100,000 COLONIES/mL ESCHERICHIA COLI Confirmed Extended Spectrum Beta-Lactamase Producer (ESBL) Performed at [facility]    Report Status 06/16/2015 FINAL  Final   Organism ID, Bacteria ESCHERICHIA COLI  Final      Susceptibility   Escherichia coli - MIC*    AMPICILLIN >=32 RESISTANT Resistant     CEFAZOLIN >=64 RESISTANT Resistant     CEFTRIAXONE >=64 RESISTANT Resistant     CIPROFLOXACIN >=4 RESISTANT Resistant     GENTAMICIN >=16 RESISTANT Resistant     IMIPENEM <=0.25 SENSITIVE Sensitive     NITROFURANTOIN <=16 SENSITIVE Sensitive     TRIMETH/SULFA <=20 SENSITIVE Sensitive     AMPICILLIN/SULBACTAM >=32 RESISTANT Resistant     PIP/TAZO 8 SENSITIVE Sensitive     * >=100,000 COLONIES/mL ESCHERICHIA COLI     Patient discharged originally without antimicrobial agent and treatment is now indicated  New antibiotic prescription: Fosfomycin 3g PO x1 dose.  ED Provider: Dierdre Forth, PA-C   Leslie Parrish 06/17/2015, 9:23 AM Infectious Diseases Pharmacist Phone# [phone]

## 2015-06-17 NOTE — Telephone Encounter (Signed)
Post ED Visit - Positive Culture Follow-up: Successful Patient Follow-Up  Culture assessed and recommendations reviewed by:  Celedonio Miyamoto, Pharm.D., BCPS-AQ ID  Georgina Pillion, Pharm.D., BCPS  [place], [place].D., BCPS, AAHIVP  Estella Husk, Pharm.D., BCPS, AAHIVP  Tegan Magsam, Pharm.D.  Casilda Carls, Pharm.D.  Positive Urine culture   Patient discharged without antimicrobial prescription and treatment is now indicated  Organism is resistant to prescribed ED discharge antimicrobial  Patient with positive blood cultures  Changes discussed with ED provider: Dierdre Forth PA New antibiotic prescription:  Fosfomycin three gram PO once Called to [facility] outpatient pharmacy  Contacted patient and daughter, date 06/17/15, time 1520   Jiles Harold 06/17/2015, 3:31 PM

## 2015-06-17 NOTE — Progress Notes (Signed)
Pre visit review using our clinic review tool, if applicable. No additional management support is needed unless otherwise documented below in the visit note. 

## 2015-06-17 NOTE — Patient Instructions (Signed)
We would like you to take miralax (polyethylene glycol) twice a day until you move your bowels.   Start taking senokot-d once a day. If you are going to take a pain pill that day you need to take 1 pill twice a day to balance it out.   We have given you the oxycodone for pain which you can take once a day.

## 2015-06-18 ENCOUNTER — Encounter: Payer: Self-pay | Admitting: Internal Medicine

## 2015-06-18 DIAGNOSIS — R0602 Shortness of breath: Secondary | ICD-10-CM | POA: Insufficient documentation

## 2015-06-18 DIAGNOSIS — J9610 Chronic respiratory failure, unspecified whether with hypoxia or hypercapnia: Secondary | ICD-10-CM | POA: Insufficient documentation

## 2015-06-18 NOTE — Assessment & Plan Note (Addendum)
Hypoxic and off O2 pulse ox sitting went to 86%. On 2.5 L O2 at all times. Feels poorly when O2 is off.Marland Kitchen

## 2015-06-18 NOTE — Assessment & Plan Note (Signed)
Creatinine appears stable on recent ER visits (records reviewed at visit). Checking BMP today for stability. Volume status appears euvolemic on exam today.

## 2015-06-18 NOTE — Assessment & Plan Note (Signed)
BP controlled on amlodipine, hydralazine, clonidine, torsemide. Suspect this is the cause of the CKD. Checking BMP today and reviewed records available in our system from recent hospital stay (for acute CHF with diuresis) and ER visits.

## 2015-06-18 NOTE — Progress Notes (Signed)
Subjective:    Patient ID: Leslie Parrish, female    DOB: 1928/05/13, 81 y.o.   MRN: [id number]  HPI The patient is an 81 YO female who is coming in new with several concerns and problems. She does have complicated PMH and her daughter is with her to help with medical history as she is not a great historian. She was recently seen at the ER several times for UTI and for AKI. She has not picked up the antibiotic for the UTI yet (just called in yesterday) although plan to pick it up on the way home. She had been having some symptoms but not clearly infected (originally was being treated as colonization). Her Creatinine was slightly higher than usual but has been stable the last 2 checks at the ER.  Her next problem is constipation. She has not gone in 7 days. This is a recurrent problem and she has had to have enemas to go in the past before. She is not on any medicine to help her go at this time. She typically goes about 5 days between bowel movements. She is not able to walk at all ands needs significant help with transferring. She is passing lots of gas and denies vomiting. Some mild nausea this morning which passed.  Her next problem is her pain in her back. It is worse since she is sitting all the time. Denies sores from sitting. Radiates into her legs. Has taken oxycodone in the past for it with good relief but it makes her constipated. Her daughter also adds that it makes her sleepy and she controls the pills as her mother might take too many of them.  Please see A/P for status and treatment of other chronic medical conditions.   PMH, [facility], social history reviewed and updated.   Review of Systems  Constitutional: Positive for activity change and appetite change. Negative for fever, chills, fatigue and unexpected weight change.  HENT: Negative.   Eyes: Negative.   Respiratory: Positive for shortness of breath. Negative for cough, chest tightness and wheezing.   Cardiovascular: Positive for leg  swelling. Negative for chest pain and palpitations.  Gastrointestinal: Positive for nausea, constipation and abdominal distention. Negative for vomiting, abdominal pain and diarrhea.  Musculoskeletal: Positive for myalgias, back pain, arthralgias and gait problem. Negative for neck pain and neck stiffness.  Skin: Negative.   Neurological: Positive for weakness and numbness. Negative for dizziness, syncope, light-headedness and headaches.  Psychiatric/Behavioral: Negative.       Objective:   Physical Exam  Constitutional: She appears well-developed and well-nourished.  Wearing O2 @ 2.5 L  HENT:  Head: Normocephalic and atraumatic.  Eyes: EOM are normal.  Neck: Normal range of motion. No JVD present.  Cardiovascular: Normal rate and regular rhythm.   Pulmonary/Chest: Effort normal. No respiratory distress. She has no wheezes. She has no rales.  Abdominal: Soft. Bowel sounds are normal. She exhibits distension. She exhibits no mass. There is no tenderness. There is no rebound and no guarding.  Bowel sounds normal, no tenderness to palpation, distended  Musculoskeletal: She exhibits edema.  Right greater than left shin and ankle swelling.  Neurological: She is alert. Coordination abnormal.  Wheelchair bound, difficulty with transferring.  Skin: Skin is warm and dry.  Color change from chronic venous stasis in bilateral legs.   Psychiatric: She has a normal mood and affect.   Filed Vitals:   06/17/15 1546  BP: 128/60  Pulse: 70  Temp: 98.2 F (36.8 C)  TempSrc:  Oral  Resp: 12  Height: 5' 4.5" (1.638 m)  SpO2: 90%      Assessment & Plan:

## 2015-06-19 ENCOUNTER — Telehealth: Payer: Self-pay | Admitting: Internal Medicine

## 2015-06-19 DIAGNOSIS — N390 Urinary tract infection, site not specified: Secondary | ICD-10-CM | POA: Insufficient documentation

## 2015-06-19 NOTE — Assessment & Plan Note (Signed)
Not in acute exacerbation today. She is a second-hand smoke exposure (never smoked herself). On 2.5 L O2 at all times.

## 2015-06-19 NOTE — Assessment & Plan Note (Addendum)
Per their reports, she is not on beta blocker or ACE-I or ARB. Will get records to see previous treatment. Echo in July EF of 30-35%, not ambulatory to determine NYHA class. She has apt to follow up with cardiology. Not in flare today. Fluid levels seem to be stable, they do not weigh at home and talked to them about the importance of that if they are able. Legs appear stable (right greater than left but left with minimal fluid) and no rales in the lungs. She is not ambulatory so unable to assess symptoms.

## 2015-06-19 NOTE — Assessment & Plan Note (Signed)
Fluid levels seem to be stable, they do not weigh at home and talked to them about the importance of that if they are able. Legs appear stable (right greater than left but left with minimal fluid) and no rales in the lungs. She is not ambulatory so unable to assess symptoms.

## 2015-06-19 NOTE — Telephone Encounter (Signed)
Katrina from [facility] called and stated pts daughter Malachi Bonds would like a call back with pts lab results Her number is [phone]

## 2015-06-19 NOTE — Telephone Encounter (Signed)
Spoke with patient and informed her of her lab results.

## 2015-06-19 NOTE — Assessment & Plan Note (Signed)
They have not gotten the fosfomycin yet and will pick it up on the way home. This is adequate coverage.

## 2015-06-19 NOTE — Assessment & Plan Note (Signed)
Getting records to check level of control. Does not appear to be on any medications at this time and last HgA1c in July 6.2 and no treatment indicated at this time. Will continue to monitor. She is not aware of complications but will get records to confirm.

## 2015-06-19 NOTE — Assessment & Plan Note (Addendum)
Unclear why she is not on a regimen of maintenance but have asked her to try miralax (she did not want something stronger at this time) BID until bowel movement. No signs of obstruction at this time. Passing good gas, no pain, minimal nausea (not present now), and good BS on exam. Once bowel movement they will do senokot-d BID for more regular bowel movements. If this is not enough we can add daily miralax as well. She is not mobile which worsens her constipation as well she does not drink much water out of fear of her heart failure.

## 2015-06-23 ENCOUNTER — Encounter: Payer: Self-pay | Admitting: Pulmonary Disease

## 2015-06-23 ENCOUNTER — Ambulatory Visit (INDEPENDENT_AMBULATORY_CARE_PROVIDER_SITE_OTHER): Payer: Medicare Other | Admitting: Pulmonary Disease

## 2015-06-23 VITALS — BP 132/66 | HR 52

## 2015-06-23 DIAGNOSIS — I5022 Chronic systolic (congestive) heart failure: Secondary | ICD-10-CM | POA: Diagnosis not present

## 2015-06-23 DIAGNOSIS — J90 Pleural effusion, not elsewhere classified: Secondary | ICD-10-CM | POA: Diagnosis not present

## 2015-06-23 DIAGNOSIS — R0602 Shortness of breath: Secondary | ICD-10-CM

## 2015-06-23 DIAGNOSIS — J9611 Chronic respiratory failure with hypoxia: Secondary | ICD-10-CM

## 2015-06-23 DIAGNOSIS — J449 Chronic obstructive pulmonary disease, unspecified: Secondary | ICD-10-CM

## 2015-06-23 MED ORDER — LEVALBUTEROL HCL 1.25 MG/3ML IN NEBU: 1.2500 mg | INHALATION_SOLUTION | Freq: Four times a day (QID) | RESPIRATORY_TRACT | Status: AC | PRN

## 2015-06-23 MED ORDER — LEVALBUTEROL HCL 1.25 MG/3ML IN NEBU: 1.2500 mg | INHALATION_SOLUTION | Freq: Four times a day (QID) | RESPIRATORY_TRACT | Status: DC | PRN

## 2015-06-23 NOTE — Patient Instructions (Signed)
For your sinuses: Use Neil Med rinses with distilled water at least twice per day using the instructions on the package. 1/2 hour after using the [facility] Med rinse, use Nasacort two puffs in each nostril once per day.  Remember that the Nasacort can take 1-2 weeks to work after regular use. Use generic zyrtec (cetirizine) every day.  If this doesn't help, then stop taking it and use chlorpheniramine-phenylephrine combination tablets.  For the shortness of breath: See a cardiologist  Use xopenex as needed for shortness of breath  Follow up with a pulmonologist in [place]

## 2015-06-23 NOTE — Addendum Note (Signed)
Addended by: Velvet Bathe on: 06/23/2015 04:08 PM   Modules accepted: Orders

## 2015-06-23 NOTE — Assessment & Plan Note (Signed)
She has had a pleural effusion seen on her CT abdomen studies from 2016. It's on the right side, no clear pathology otherwise. This could be related to chronic aspiration versus or CHF. I explained to her today that the best way to assess this would be a thoracentesis, but she was not very excited about having this done. Before doing a thoracentesis we would need to see how long this has been present, so to that end I like to request records from her old pulmonologist to see if they are aware of it. Performing a thoracentesis on a chronic effusion could lead to reexpansion pulmonary edema so we need to be cautious with this. In general I would choose to treat it as we treat her congestive heart failure as this would be the most likely etiology.

## 2015-06-23 NOTE — Assessment & Plan Note (Signed)
This is due primarily to her congestive heart failure, obesity, and deconditioning. It's not clear to me that she has a lung disease.  Continue using oxygen 2.5 L continuously

## 2015-06-23 NOTE — Assessment & Plan Note (Addendum)
She does not have COPD as today's simple spirometry was normal. She currently uses xopenex on an as-needed basis. Her cough and upper airway wheeze are all due to upper airway cough syndrome from postnasal drip and likely some degree of acid reflux.  Plan: Continue Xopenex as needed Obtain records from her old pulmonologist If she chooses to live in [place] than we will see her again, but she tells me today she's moving to [place]

## 2015-06-23 NOTE — Assessment & Plan Note (Signed)
This seems to be the major source of her problems as she is volume overloaded on exam, notes orthopnea, and has an EF of 30-35%.  Plan: I recommended that she see a cardiologist

## 2015-06-23 NOTE — Progress Notes (Signed)
Subjective:    Patient ID: Leslie Parrish, female    DOB: 22-Nov-1927, 81 y.o.   MRN: [id number]  HPI Chief Complaint  Patient presents with  . Advice Only    Self-referral for COPD.  pt recently moved from [facility] and is establishing care.     This is an 81 year old female who showed up to clinic 30 minutes late today and insisted on being seen because she claimed it was an emergency situation. However, when she arrived it was in fact true that there is no emergency. She stated that she's here because she wants to establish care for all of her medical issues. She has recently relocated from [place] to [place]. She says she has a long-standing history of COPD. She never smoked cigarettes, and she is unaware of results of pulmonary function testing in the past. However, she did have a pulmonologist in [place]. She has been told that she recently had "a touch of" pneumonia and she has "a little bit of" emphysema.  She's used oxygen for quite some time and has a history of congestive heart failure. According to her daughter she was hospitalized for a coronary artery bypass in 2002 and has not walked since then. She has been restricted to a chair and assistance with mild movement. She has shortness of breath sometimes even at rest. She gets short of breath in the middle the night and has to get up and sit in a chair. She has a dry cough. She has a sensation of some mucus congestion in her throat from time to time. She does note sinus drainage at night that will run down in her throat. She says that she wants me to help her by trying to help improve her dyspnea.   Past Medical History  Diagnosis Date  . Hypertension   . CHF (congestive heart failure)   . GERD (gastroesophageal reflux disease)   . COPD (chronic obstructive pulmonary disease)   . Asthma   . Diabetes mellitus without complication   . UTI (lower urinary tract infection)   . CAD (coronary artery disease)      Family History    Problem Relation Age of Onset  . Heart disease Mother   . Emphysema Father   . Diabetes Brother      Social History   Social History  . Marital Status: Single    Spouse Name: N/A  . Number of Children: N/A  . Years of Education: N/A   Occupational History  . Not on file.   Social History Main Topics  . Smoking status: Never Smoker   . Smokeless tobacco: Never Used  . Alcohol Use: No  . Drug Use: No  . Sexual Activity: No   Other Topics Concern  . Not on file   Social History Narrative     Allergies  Allergen Reactions  . Albuterol Sulfate Other (See Comments)    Per Md- does not want her taking because it raises her blood pressure.   Marland Kitchen Penicillins Swelling  . Sulfa Antibiotics Rash  . Compazine [Prochlorperazine Edisylate] Palpitations     Outpatient Prescriptions Prior to Visit  Medication Sig Dispense Refill  . amLODipine (NORVASC) 10 MG tablet Take 1 tablet (10 mg total) by mouth daily. 90 tablet 2  . anastrozole (ARIMIDEX) 1 MG tablet Take 1 mg by mouth daily.    Marland Kitchen aspirin EC 81 MG tablet Take 81 mg by mouth daily.    . Cholecalciferol (VITAMIN D-3 PO) Take 1  tablet by mouth daily.    . cloNIDine (CATAPRES) 0.2 MG tablet Take 1 tablet (0.2 mg total) by mouth 2 (two) times daily. 180 tablet 2  . dicyclomine (BENTYL) 20 MG tablet Take 20 mg by mouth daily as needed for spasms.    Marland Kitchen erythromycin ophthalmic ointment Place a 1/2 inch ribbon of ointment into the lower eyelid. 1 g 0  . hydrALAZINE (APRESOLINE) 100 MG tablet Take 1 tablet (100 mg total) by mouth 3 (three) times daily. 270 tablet 2  . ketoconazole (NIZORAL) 2 % cream Apply 1 application topically at bedtime.  3  . ondansetron (ZOFRAN ODT) 4 MG disintegrating tablet Take 1 tablet (4 mg total) by mouth every 8 (eight) hours as needed for nausea or vomiting. 20 tablet 0  . oxyCODONE (OXY IR/ROXICODONE) 5 MG immediate release tablet Take 0.5 tablets (2.5 mg total) by mouth daily as needed for moderate pain  or severe pain. 30 tablet 0  . pantoprazole (PROTONIX) 40 MG tablet Take 1 tablet (40 mg total) by mouth daily. 90 tablet 2  . polyethylene glycol (MIRALAX / GLYCOLAX) packet Take 17 g by mouth 2 (two) times daily. (Patient taking differently: Take 17 g by mouth daily as needed for moderate constipation. ) 30 each 0  . potassium chloride SA (K-DUR,KLOR-CON) 20 MEQ tablet Take 1 tablet (20 mEq total) by mouth daily. 90 tablet 2  . sennosides-docusate sodium (SENOKOT-S) 8.6-50 MG tablet Take 1 tablet by mouth 2 (two) times daily. 60 tablet 3  . torsemide (DEMADEX) 20 MG tablet Take 1 tablet (20 mg total) by mouth daily. 90 tablet 2  . levalbuterol (XOPENEX) 1.25 MG/3ML nebulizer solution INHALE CONTENTS OF 1 VIAL VIA NEBULIZER INTO THE LUNGS FOUR TIMES DAILY 1080 mL 0   No facility-administered medications prior to visit.       Review of Systems  Constitutional: Negative for fever and unexpected weight change.  HENT: Negative for congestion, dental problem, ear pain, nosebleeds, postnasal drip, rhinorrhea, sinus pressure, sneezing, sore throat and trouble swallowing.   Eyes: Negative for redness and itching.  Respiratory: Positive for shortness of breath. Negative for cough, chest tightness and wheezing.   Cardiovascular: Negative for palpitations and leg swelling.  Gastrointestinal: Negative for nausea and vomiting.  Genitourinary: Negative for dysuria.  Musculoskeletal: Negative for joint swelling.  Skin: Negative for rash.  Neurological: Negative for headaches.  Hematological: Does not bruise/bleed easily.  Psychiatric/Behavioral: Negative for dysphoric mood. The patient is not nervous/anxious.        Objective:   Physical Exam Filed Vitals:   06/23/15 1520  BP: 132/66  Pulse: 52  SpO2: 91%  2.5L O2  Gen: chronically ill appearing, obese, no acute distress HENT: NCAT, OP clear, neck supple without masses Eyes: PERRL, EOMi Lymph: no cervical lymphadenopathy PULM: Crackles  bases, diminished R base, mild upper airway wheeze, well healed tracheostomy scar CV: RRR, S3, mild systolic murmur apex, no JVD GI: BS+, soft, nontender, no hsm Derm: no rash or skin breakdown MSK: normal bulk and tone Neuro: A&Ox4, CN II-XII intact, strength 5/5 in all 4 extremities Psyche: normal mood and affect   July 2016 echocardiogram reviewed systolic heart failure with LVEF of 30-35% Primary care physician records reviewed were she was treated for multiple problems including congestive heart failure and chronic hypoxemic respiratory failure    Assessment & Plan:  Chronic respiratory failure This is due primarily to her congestive heart failure, obesity, and deconditioning. It's not clear to me that she has  a lung disease.  Continue using oxygen 2.5 L continuously  Shortness of breath She does not have COPD as today's simple spirometry was normal. She currently uses xopenex on an as-needed basis. Her cough and upper airway wheeze are all due to upper airway cough syndrome from postnasal drip and likely some degree of acid reflux.  Plan: Continue Xopenex as needed Obtain records from her old pulmonologist If she chooses to live in [place] than we will see her again, but she tells me today she's moving to [place]  CHF (congestive heart failure) This seems to be the major source of her problems as she is volume overloaded on exam, notes orthopnea, and has an EF of 30-35%.  Plan: I recommended that she see a cardiologist  Pleural effusion She has had a pleural effusion seen on her CT abdomen studies from 2016. It's on the right side, no clear pathology otherwise. This could be related to chronic aspiration versus or CHF. I explained to her today that the best way to assess this would be a thoracentesis, but she was not very excited about having this done. Before doing a thoracentesis we would need to see how long this has been present, so to that end I like to request  records from her old pulmonologist to see if they are aware of it. Performing a thoracentesis on a chronic effusion could lead to reexpansion pulmonary edema so we need to be cautious with this. In general I would choose to treat it as we treat her congestive heart failure as this would be the most likely etiology.     Current outpatient prescriptions:  .  amLODipine (NORVASC) 10 MG tablet, Take 1 tablet (10 mg total) by mouth daily., Disp: 90 tablet, Rfl: 2 .  anastrozole (ARIMIDEX) 1 MG tablet, Take 1 mg by mouth daily., Disp: , Rfl:  .  aspirin EC 81 MG tablet, Take 81 mg by mouth daily., Disp: , Rfl:  .  Cholecalciferol (VITAMIN D-3 PO), Take 1 tablet by mouth daily., Disp: , Rfl:  .  cloNIDine (CATAPRES) 0.2 MG tablet, Take 1 tablet (0.2 mg total) by mouth 2 (two) times daily., Disp: 180 tablet, Rfl: 2 .  dicyclomine (BENTYL) 20 MG tablet, Take 20 mg by mouth daily as needed for spasms., Disp: , Rfl:  .  erythromycin ophthalmic ointment, Place a 1/2 inch ribbon of ointment into the lower eyelid., Disp: 1 g, Rfl: 0 .  hydrALAZINE (APRESOLINE) 100 MG tablet, Take 1 tablet (100 mg total) by mouth 3 (three) times daily., Disp: 270 tablet, Rfl: 2 .  ketoconazole (NIZORAL) 2 % cream, Apply 1 application topically at bedtime., Disp: , Rfl: 3 .  levalbuterol (XOPENEX) 1.25 MG/3ML nebulizer solution, Take 1.25 mg by nebulization every 6 (six) hours as needed for wheezing or shortness of breath., Disp: 1080 mL, Rfl: 2 .  ondansetron (ZOFRAN ODT) 4 MG disintegrating tablet, Take 1 tablet (4 mg total) by mouth every 8 (eight) hours as needed for nausea or vomiting., Disp: 20 tablet, Rfl: 0 .  oxyCODONE (OXY IR/ROXICODONE) 5 MG immediate release tablet, Take 0.5 tablets (2.5 mg total) by mouth daily as needed for moderate pain or severe pain., Disp: 30 tablet, Rfl: 0 .  pantoprazole (PROTONIX) 40 MG tablet, Take 1 tablet (40 mg total) by mouth daily., Disp: 90 tablet, Rfl: 2 .  polyethylene glycol  (MIRALAX / GLYCOLAX) packet, Take 17 g by mouth 2 (two) times daily. (Patient taking differently: Take 17 g  by mouth daily as needed for moderate constipation. ), Disp: 30 each, Rfl: 0 .  potassium chloride SA (K-DUR,KLOR-CON) 20 MEQ tablet, Take 1 tablet (20 mEq total) by mouth daily., Disp: 90 tablet, Rfl: 2 .  sennosides-docusate sodium (SENOKOT-S) 8.6-50 MG tablet, Take 1 tablet by mouth 2 (two) times daily., Disp: 60 tablet, Rfl: 3 .  torsemide (DEMADEX) 20 MG tablet, Take 1 tablet (20 mg total) by mouth daily., Disp: 90 tablet, Rfl: 2

## 2015-07-16 NOTE — Progress Notes (Signed)
Cardiology Office Note   Date:  07/17/2015   ID:  Leslie Parrish, DOB 01-Nov-1927, MRN [id number]  PCP:  Leslie Bonus, MD  Cardiologist:  Dr. Tonny Bollman   Electrophysiologist:  n/a  Chief Complaint  Patient presents with  . Congestive Heart Failure     History of Present Illness: Leslie Parrish is a 81 y.o. female with a hx of chronic combined systolic and diastolic CHF, CAD s/p CABG in [place], [place], COPD on Home O2.    Admitted in 7/16 with abdominal pain initially thought to be related to constipation and a UTI. Hospital stay was complicated by a/c respiratory failure related to a/c combined systolic and diastolic CHF and AECOPD.  She was diuresed with IV Lasix and beta agonist nebulizer treatments.  LLL nodule was noted on CT.  She was noted to have episodes of NSVT along with episodes of marked bradycardia. Given her bradycardia, beta-blocker therapy was not started.  Clonidine rx was reduced as well.  She was seen in FU with Leslie Finlay, PA-C 06/04/15.  She returns for FU.     Studies/Reports Reviewed Today:  Echo 05/16/15 Mild LVH, EF 30-35%, no RWMA, Gr 2 DD, paradoxical ventricular septal motion, MAC, mild MS, mild MR, mod LAE, mod reduced RVSF, mild TR   Past Medical History  Diagnosis Date  . Hypertension   . CHF (congestive heart failure)   . GERD (gastroesophageal reflux disease)   . COPD (chronic obstructive pulmonary disease)   . Asthma   . Diabetes mellitus without complication   . UTI (lower urinary tract infection)   . CAD (coronary artery disease)     Past Surgical History  Procedure Laterality Date  . Abdominal hysterectomy    . Appendectomy    . Coronary artery bypass graft       Current Outpatient Prescriptions  Medication Sig Dispense Refill  . amLODipine (NORVASC) 10 MG tablet Take 1 tablet (10 mg total) by mouth daily. 90 tablet 2  . anastrozole (ARIMIDEX) 1 MG tablet Take 1 mg by mouth daily.    Marland Kitchen aspirin EC 81 MG tablet Take 81  mg by mouth daily.    . Cholecalciferol (VITAMIN D-3 PO) Take 1 tablet by mouth daily.    . cloNIDine (CATAPRES) 0.2 MG tablet Take 1 tablet (0.2 mg total) by mouth 2 (two) times daily. 180 tablet 2  . dicyclomine (BENTYL) 20 MG tablet Take 20 mg by mouth daily as needed for spasms.    Marland Kitchen erythromycin ophthalmic ointment Place a 1/2 inch ribbon of ointment into the lower eyelid. 1 g 0  . hydrALAZINE (APRESOLINE) 100 MG tablet Take 1 tablet (100 mg total) by mouth 3 (three) times daily. 270 tablet 2  . ketoconazole (NIZORAL) 2 % cream Apply 1 application topically at bedtime.  3  . levalbuterol (XOPENEX) 1.25 MG/3ML nebulizer solution Take 1.25 mg by nebulization every 6 (six) hours as needed for wheezing or shortness of breath. 1080 mL 2  . ondansetron (ZOFRAN ODT) 4 MG disintegrating tablet Take 1 tablet (4 mg total) by mouth every 8 (eight) hours as needed for nausea or vomiting. 20 tablet 0  . oxyCODONE (OXY IR/ROXICODONE) 5 MG immediate release tablet Take 0.5 tablets (2.5 mg total) by mouth daily as needed for moderate pain or severe pain. 30 tablet 0  . pantoprazole (PROTONIX) 40 MG tablet Take 1 tablet (40 mg total) by mouth daily. 90 tablet 2  . polyethylene glycol (MIRALAX / GLYCOLAX) packet Take  17 g by mouth 2 (two) times daily. (Patient taking differently: Take 17 g by mouth daily as needed for moderate constipation. ) 30 each 0  . potassium chloride SA (K-DUR,KLOR-CON) 20 MEQ tablet Take 1 tablet (20 mEq total) by mouth daily. 90 tablet 2  . sennosides-docusate sodium (SENOKOT-S) 8.6-50 MG tablet Take 1 tablet by mouth 2 (two) times daily. 60 tablet 3  . torsemide (DEMADEX) 20 MG tablet Take 1 tablet (20 mg total) by mouth daily. 90 tablet 2   No current facility-administered medications for this visit.    Allergies:   Albuterol sulfate; Penicillins; Sulfa antibiotics; and Compazine    Social History:  The patient  reports that she has never smoked. She has never used smokeless  tobacco. She reports that she does not drink alcohol or use illicit drugs.   Family History:  The patient's family history includes Diabetes in her brother; Emphysema in her father; Heart disease in her mother.    ROS:   Please see the history of present illness.   ROS    PHYSICAL EXAM: VS:  There were no vitals taken for this visit.    Wt Readings from Last 3 Encounters:  06/04/15 175 lb (79.379 kg)  05/22/15 181 lb 3.5 oz (82.2 kg)  04/22/15 175 lb (79.379 kg)     GEN: Well nourished, well developed, in no acute distress HEENT: normal Neck: no JVD, no carotid bruits, no masses Cardiac:  Normal S1/S2, RRR; no murmur ,  no rubs or gallops, no edema  Respiratory:  clear to auscultation bilaterally, no wheezing, rhonchi or rales. GI: soft, nontender, nondistended, + BS MS: no deformity or atrophy Skin: warm and dry  Neuro:  CNs II-XII intact, Strength and sensation are intact Psych: Normal affect   EKG:  EKG is ordered today.  It demonstrates:      Recent Labs: 05/14/2015: B Natriuretic Peptide 1487.4*; TSH 2.745 05/15/2015: ALT 6* 06/17/2015: BUN 54*; Creatinine, Ser 1.73*; Hemoglobin 9.7*; Magnesium 2.1; Platelets 227.0; Potassium 3.9; Sodium 140    Lipid Panel    Component Value Date/Time   CHOL 121 05/15/2015 0200   TRIG 254* 05/15/2015 0200   HDL 21* 05/15/2015 0200   CHOLHDL 5.8 05/15/2015 0200   VLDL 51* 05/15/2015 0200   LDLCALC 49 05/15/2015 0200      ASSESSMENT AND PLAN:  1. Chronic Combined Systolic and Diastolic CHF:    2. CAD s/p CABG:    3. Dilated Cardiomyopathy:   4. NSVT:  Not on beta-blocker therapy due to hx of marked bradycardia.  5. Chronic Kidney Disease:    6. HTN:    7. Hyperlipidemia:    8. COPD:   On chronic O2.       Medication Changes: Current medicines are reviewed at length with the patient today.  Concerns regarding medicines are as outlined above.  The following changes have been made:   Discontinued Medications    No medications on file   Modified Medications   No medications on file   New Prescriptions   No medications on file    Labs/ tests ordered today include:   No orders of the defined types were placed in this encounter.      Disposition:    FU with    Signed, Tereso Newcomer, PA-C, MHS 07/17/2015 1:46 PM    [facility] [address] [place], [place], [place]  [address] Phone: [phone]; Fax: [phone]    This encounter was created  in error - please disregard.

## 2015-07-17 ENCOUNTER — Encounter: Payer: Medicare Other | Admitting: Physician Assistant

## 2015-07-25 ENCOUNTER — Encounter: Payer: Self-pay | Admitting: Physician Assistant

## 2015-08-18 ENCOUNTER — Ambulatory Visit: Payer: Medicare Other | Admitting: Internal Medicine

## 2015-08-18 DIAGNOSIS — Z0289 Encounter for other administrative examinations: Secondary | ICD-10-CM

## 2015-09-12 ENCOUNTER — Ambulatory Visit: Payer: Medicare Other | Admitting: Cardiovascular Disease

## 2015-09-12 DIAGNOSIS — R0989 Other specified symptoms and signs involving the circulatory and respiratory systems: Secondary | ICD-10-CM

## 2015-09-16 ENCOUNTER — Encounter: Payer: Self-pay | Admitting: Cardiovascular Disease

## 2016-08-04 IMAGING — CT CT ABD-PELV W/O CM
2 of 4 series · 15 of 46 positions shown, 17 images · non-contrast
Comparison: None.

CLINICAL DATA: Left lower quadrant abdominal pain. Urinary tract
infection.

EXAM:
CT ABDOMEN AND PELVIS WITHOUT CONTRAST
TECHNIQUE: Multidetector CT imaging of the abdomen and pelvis was performed
following the standard protocol without IV contrast.

[Series 2: abd/ pelvis 5.0 i30f 1 · axial · 0.79mm/px · z∈[-384,+11]mm · 12 of 91 slices shown, 14 images]
[im 8/91  soft-tissue]
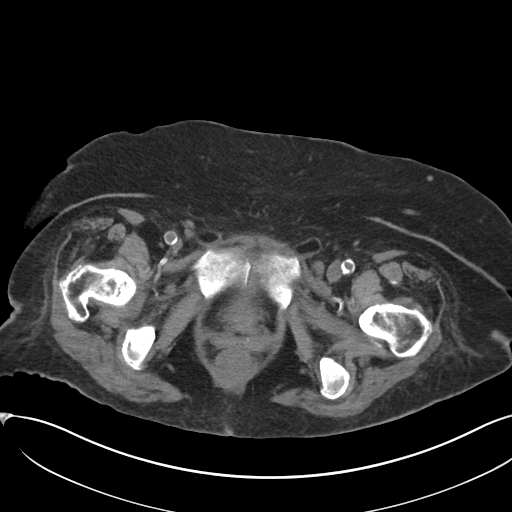
[im 8/91  bone]
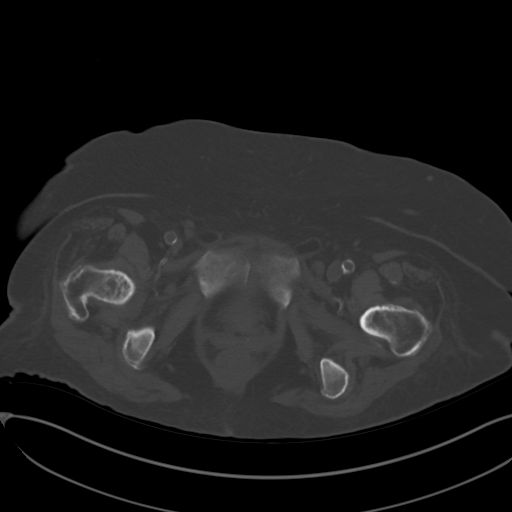
[im 15/91  soft-tissue]
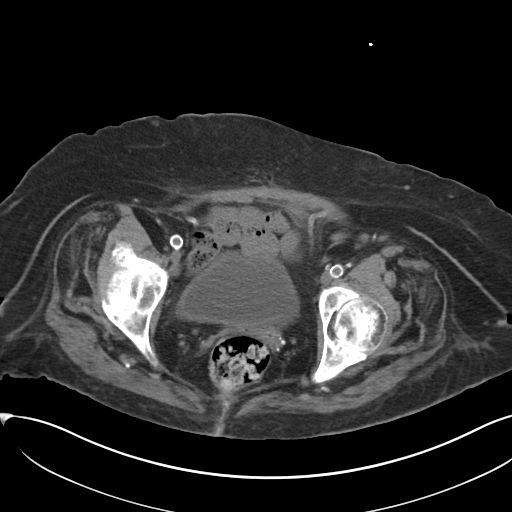
[im 22/91  soft-tissue]
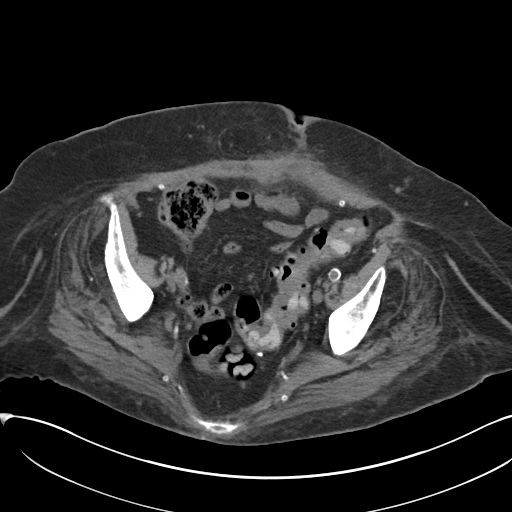
[im 29/91  soft-tissue]
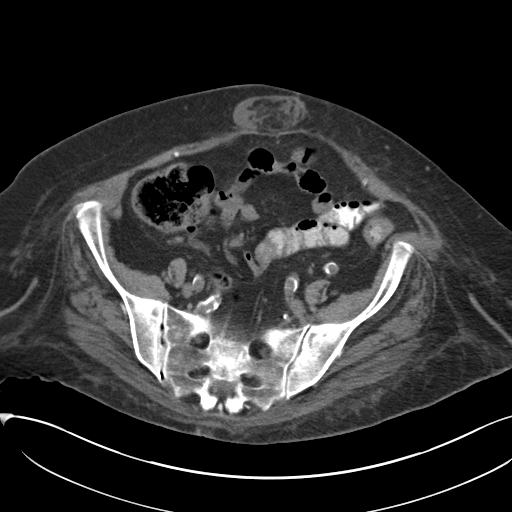
[im 37/91  soft-tissue]
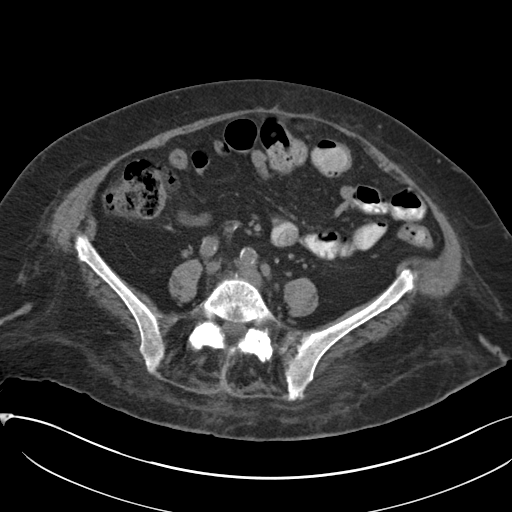
[im 44/91  soft-tissue]
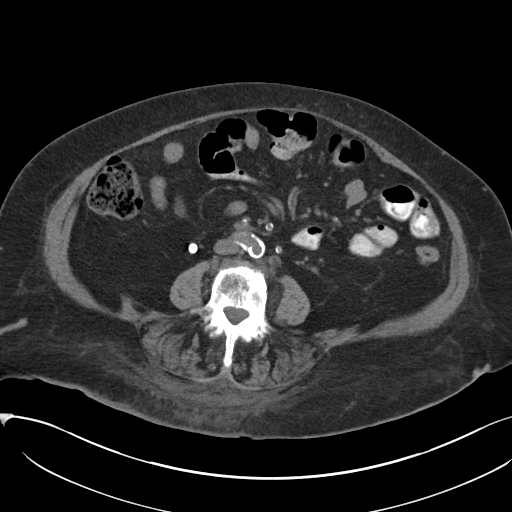
[im 51/91  soft-tissue]
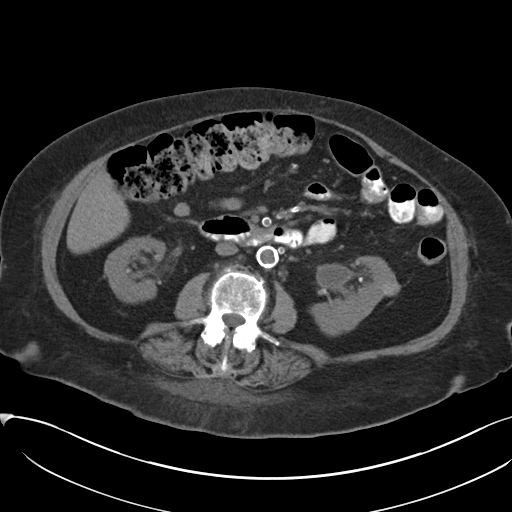
[im 58/91  soft-tissue]
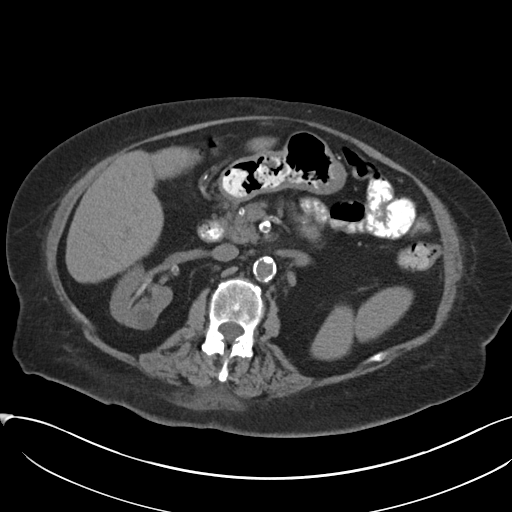
[im 65/91  soft-tissue]
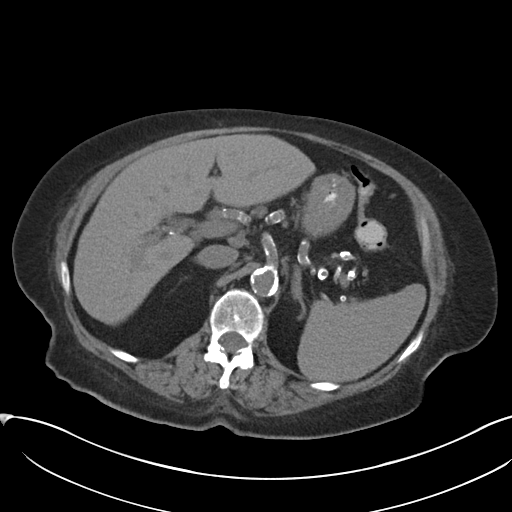
[im 65/91  bone]
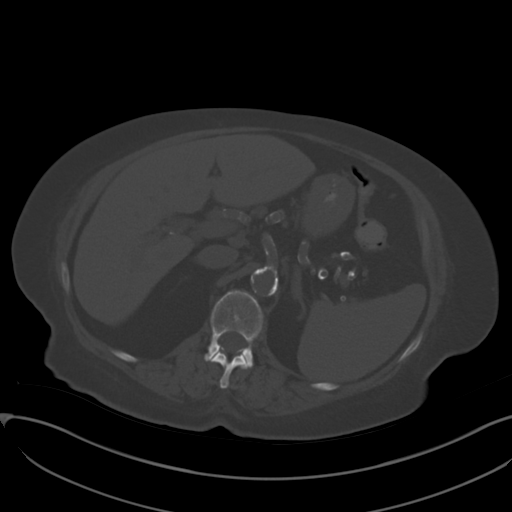
[im 73/91  soft-tissue]
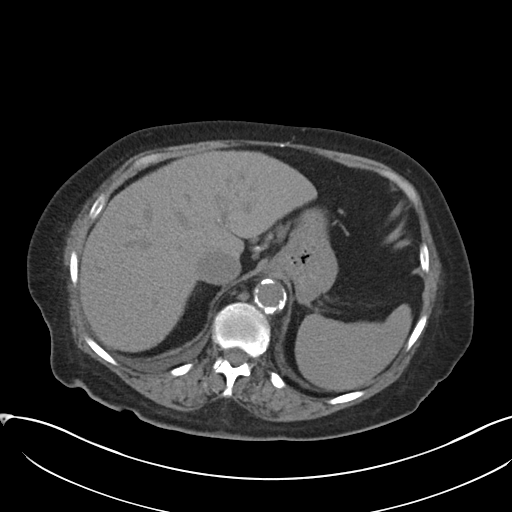
[im 80/91  soft-tissue]
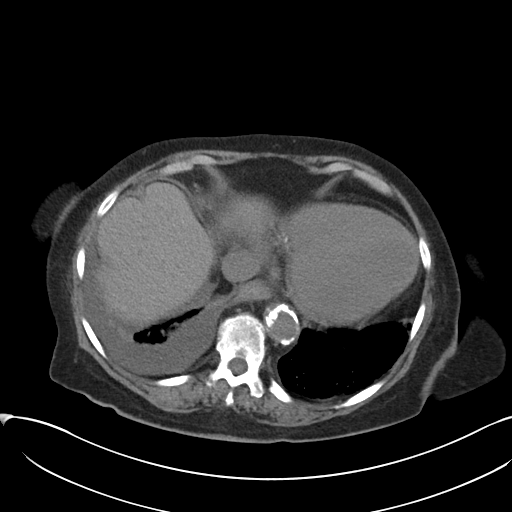
[im 87/91  soft-tissue]
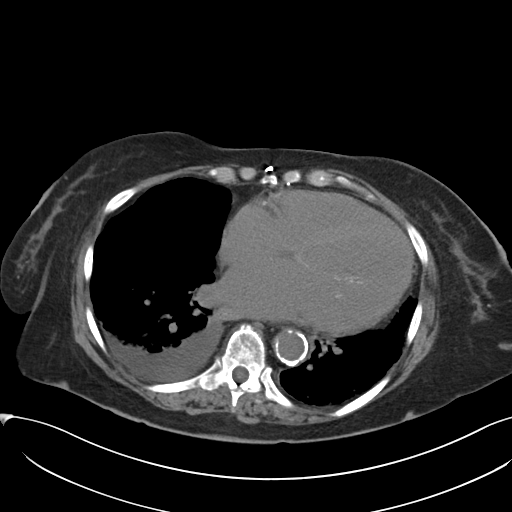

[Series 5: coronals · coronal · 0.79mm/px · 3 of 143 slices shown]
[im 48/143  soft-tissue]
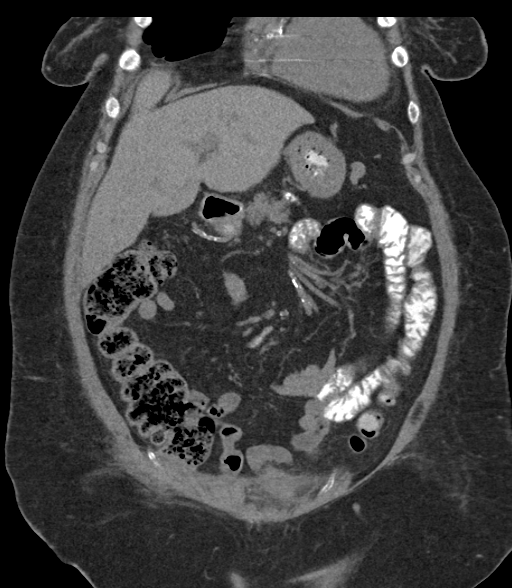
[im 64/143  soft-tissue]
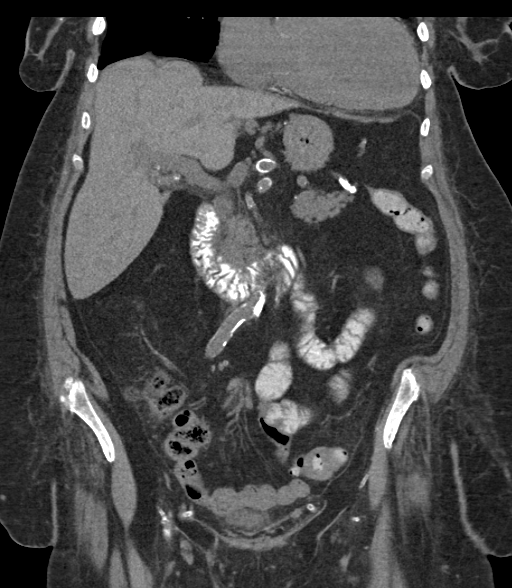
[im 79/143  soft-tissue]
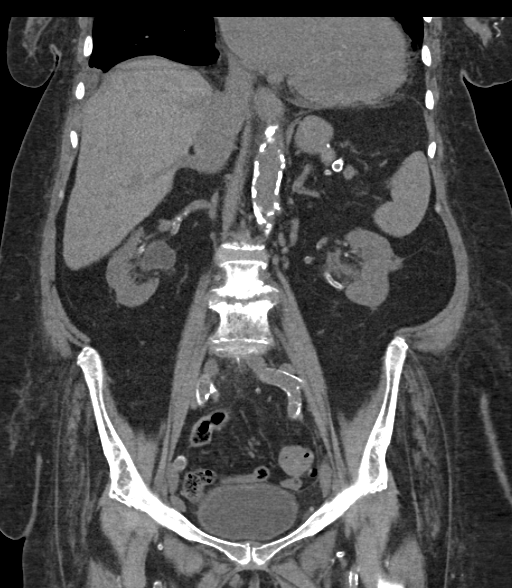

[15 of 46 positions shown; findings below may reference images not displayed]

FINDINGS: Lower chest: Coronary atherosclerosis. Mild aortic and mitral valve
calcification. Low-density blood pool. Mild cardiomegaly.

Small to moderate right pleural effusion, nonspecific for
transudative versus exudative etiology.

6 mm left lower lobe pulmonary nodule, image 3 series 3.

Hepatobiliary: Small densities in the contracted gallbladder,
suspicious for gallstones.

Pancreas: Unremarkable

Spleen: Unremarkable

Adrenals/Urinary Tract: Right kidney upper pole exophytic hypodense
1.6 cm lesion, internal density -6 Hounsfield units. Left anterior
mid kidney 1.6 hyper dense lesion, internal density 56 Hounsfield
units. No hydronephrosis or hydroureter. Vascular calcifications in
both renal hila.

Stomach/Bowel: Sigmoid diverticulosis.

Vascular/Lymphatic: Dense aortic and branch vessel atherosclerotic
vascular disease. Left common iliac node 1.0 cm in short axis,
borderline enlarged.

Reproductive: Uterus absent.  Ovaries not well seen.

Other: No supplemental non-categorized findings.

Musculoskeletal: Ventral supraumbilical hernia measuring 6.1 by
by 2.9 cm contains fluid and fat density. No herniated bowel. There
is also a right paracentral ventral hernia at the level of the
umbilicus containing only adipose tissue.

Lumbar spondylosis and degenerative disc disease with grade 1
retrolisthesis at L3-4 and grade 1 anterolisthesis at L4-5. Endplate
irregularity is likely due to Schmorl' s nodes and degenerative
endplate findings at the L3-4 level. Left lateral recess foraminal
disc protrusion at L2-3; left paracentral disc protrusion at L3-4 ;
extensive facet arthropathy at L4-5, with suspected impingement at
each of these levels.
IMPRESSION: 1. Supraumbilical hernia contains adipose tissue and fluid, and
could represent inflamed or ischemic herniated omental adipose
tissue. Just below this there is another right paracentral ventral
hernia containing only adipose tissue.
2. Abnormal fluid/thickening along the posterior margin of the left
inferior rectus abdominus muscle, possibly postoperative although a
small hematoma in this vicinity is not excluded.
3. Hyperdense left anterior mid kidney lesion, probably a complex
cyst although mass is not excluded given the lack of IV contrast.
4. Extensive atherosclerosis including the coronary arteries, with
aortic and mitral valve calcification as well.
5. Low-density blood pool suggests anemia.
6. Mild cardiomegaly.
7. Small to moderate right pleural effusion.
8. 6 mm left lower lobe pulmonary nodule. If the patient is at high
risk for bronchogenic carcinoma, follow-up chest CT at 6-12 months
is recommended. If the patient is at low risk for bronchogenic
carcinoma, follow-up chest CT at 12 months is recommended. This
recommendation follows the consensus statement: Guidelines for
Management of Small Pulmonary Nodules Detected on CT Scans: A
Statement from the [HOSPITAL] as published in Radiology
1224;[DATE].
9. Sigmoid diverticulosis without active diverticulitis.
10. Lumbar spondylosis and degenerative disc disease causing lower
lumbar impingement.
11. Cholelithiasis
